# Patient Record
Sex: Female | Born: 1950 | Race: White | Hispanic: No | Smoking: Never smoker
Health system: Southern US, Community
[De-identification: ages and names within clinical notes are randomized; demographics above are authoritative.]

## PROBLEM LIST (undated history)

## (undated) DIAGNOSIS — N950 Postmenopausal bleeding: Secondary | ICD-10-CM

## (undated) DIAGNOSIS — E039 Hypothyroidism, unspecified: Secondary | ICD-10-CM

## (undated) DIAGNOSIS — C4491 Basal cell carcinoma of skin, unspecified: Secondary | ICD-10-CM

## (undated) DIAGNOSIS — F909 Attention-deficit hyperactivity disorder, unspecified type: Secondary | ICD-10-CM

## (undated) DIAGNOSIS — M1990 Unspecified osteoarthritis, unspecified site: Secondary | ICD-10-CM

## (undated) DIAGNOSIS — F32A Depression, unspecified: Secondary | ICD-10-CM

## (undated) DIAGNOSIS — R600 Localized edema: Secondary | ICD-10-CM

## (undated) DIAGNOSIS — E079 Disorder of thyroid, unspecified: Secondary | ICD-10-CM

## (undated) DIAGNOSIS — C4492 Squamous cell carcinoma of skin, unspecified: Secondary | ICD-10-CM

## (undated) DIAGNOSIS — F419 Anxiety disorder, unspecified: Secondary | ICD-10-CM

## (undated) DIAGNOSIS — K219 Gastro-esophageal reflux disease without esophagitis: Secondary | ICD-10-CM

## (undated) DIAGNOSIS — E785 Hyperlipidemia, unspecified: Secondary | ICD-10-CM

## (undated) DIAGNOSIS — E876 Hypokalemia: Secondary | ICD-10-CM

## (undated) HISTORY — DX: Attention-deficit hyperactivity disorder, unspecified type: F90.9

## (undated) HISTORY — DX: Anxiety disorder, unspecified: F41.9

## (undated) HISTORY — DX: Localized edema: R60.0

## (undated) HISTORY — DX: Hyperlipidemia, unspecified: E78.5

## (undated) HISTORY — DX: Hypokalemia: E87.6

## (undated) HISTORY — DX: Squamous cell carcinoma of skin, unspecified: C44.92

## (undated) HISTORY — DX: Basal cell carcinoma of skin, unspecified: C44.91

## (undated) HISTORY — PX: OTHER SURGICAL HISTORY: SHX169

## (undated) HISTORY — DX: Postmenopausal bleeding: N95.0

## (undated) HISTORY — DX: Unspecified osteoarthritis, unspecified site: M19.90

## (undated) HISTORY — PX: HAND SURGERY: SHX662

## (undated) HISTORY — DX: Gastro-esophageal reflux disease without esophagitis: K21.9

## (undated) HISTORY — PX: POPLITEAL SYNOVIAL CYST EXCISION: SUR555

## (undated) HISTORY — PX: TONSILLECTOMY: SHX5217

## (undated) HISTORY — DX: Disorder of thyroid, unspecified: E07.9

---

## 1998-03-27 ENCOUNTER — Ambulatory Visit (HOSPITAL_COMMUNITY): Admission: RE | Admit: 1998-03-27 | Discharge: 1998-03-27 | Payer: Self-pay | Admitting: *Deleted

## 1999-03-09 ENCOUNTER — Other Ambulatory Visit: Admission: RE | Admit: 1999-03-09 | Discharge: 1999-03-09 | Payer: Self-pay | Admitting: *Deleted

## 1999-04-19 ENCOUNTER — Encounter: Payer: Self-pay | Admitting: *Deleted

## 1999-04-19 ENCOUNTER — Ambulatory Visit (HOSPITAL_COMMUNITY): Admission: RE | Admit: 1999-04-19 | Discharge: 1999-04-19 | Payer: Self-pay | Admitting: *Deleted

## 2000-03-06 ENCOUNTER — Other Ambulatory Visit: Admission: RE | Admit: 2000-03-06 | Discharge: 2000-03-06 | Payer: Self-pay | Admitting: *Deleted

## 2000-09-09 DIAGNOSIS — E079 Disorder of thyroid, unspecified: Secondary | ICD-10-CM

## 2000-09-09 HISTORY — DX: Disorder of thyroid, unspecified: E07.9

## 2000-12-11 ENCOUNTER — Ambulatory Visit (HOSPITAL_COMMUNITY): Admission: RE | Admit: 2000-12-11 | Discharge: 2000-12-11 | Payer: Self-pay | Admitting: *Deleted

## 2000-12-11 ENCOUNTER — Encounter: Payer: Self-pay | Admitting: *Deleted

## 2001-02-11 ENCOUNTER — Other Ambulatory Visit: Admission: RE | Admit: 2001-02-11 | Discharge: 2001-02-11 | Payer: Self-pay | Admitting: *Deleted

## 2002-01-07 DIAGNOSIS — N950 Postmenopausal bleeding: Secondary | ICD-10-CM

## 2002-01-07 HISTORY — PX: HYSTEROSCOPY: SHX211

## 2002-01-07 HISTORY — DX: Postmenopausal bleeding: N95.0

## 2002-01-29 ENCOUNTER — Encounter (INDEPENDENT_AMBULATORY_CARE_PROVIDER_SITE_OTHER): Payer: Self-pay | Admitting: *Deleted

## 2002-01-29 ENCOUNTER — Ambulatory Visit (HOSPITAL_COMMUNITY): Admission: RE | Admit: 2002-01-29 | Discharge: 2002-01-29 | Payer: Self-pay | Admitting: Obstetrics and Gynecology

## 2002-03-18 ENCOUNTER — Other Ambulatory Visit: Admission: RE | Admit: 2002-03-18 | Discharge: 2002-03-18 | Payer: Self-pay | Admitting: Obstetrics and Gynecology

## 2002-09-10 ENCOUNTER — Encounter: Payer: Self-pay | Admitting: *Deleted

## 2002-09-10 ENCOUNTER — Ambulatory Visit (HOSPITAL_COMMUNITY): Admission: RE | Admit: 2002-09-10 | Discharge: 2002-09-10 | Payer: Self-pay | Admitting: *Deleted

## 2003-04-11 ENCOUNTER — Other Ambulatory Visit: Admission: RE | Admit: 2003-04-11 | Discharge: 2003-04-11 | Payer: Self-pay | Admitting: Obstetrics and Gynecology

## 2003-11-28 ENCOUNTER — Ambulatory Visit (HOSPITAL_COMMUNITY): Admission: RE | Admit: 2003-11-28 | Discharge: 2003-11-28 | Payer: Self-pay | Admitting: *Deleted

## 2004-03-13 ENCOUNTER — Encounter: Admission: RE | Admit: 2004-03-13 | Discharge: 2004-03-13 | Payer: Self-pay | Admitting: Neurology

## 2004-05-30 ENCOUNTER — Other Ambulatory Visit: Admission: RE | Admit: 2004-05-30 | Discharge: 2004-05-30 | Payer: Self-pay | Admitting: Obstetrics and Gynecology

## 2004-06-01 ENCOUNTER — Encounter: Admission: RE | Admit: 2004-06-01 | Discharge: 2004-06-01 | Payer: Self-pay | Admitting: *Deleted

## 2004-09-17 ENCOUNTER — Ambulatory Visit (HOSPITAL_COMMUNITY): Admission: RE | Admit: 2004-09-17 | Discharge: 2004-09-17 | Payer: Self-pay | Admitting: Gastroenterology

## 2005-07-18 ENCOUNTER — Other Ambulatory Visit: Admission: RE | Admit: 2005-07-18 | Discharge: 2005-07-18 | Payer: Self-pay | Admitting: Obstetrics and Gynecology

## 2006-03-26 ENCOUNTER — Ambulatory Visit (HOSPITAL_COMMUNITY): Admission: RE | Admit: 2006-03-26 | Discharge: 2006-03-26 | Payer: Self-pay | Admitting: Obstetrics & Gynecology

## 2006-07-21 ENCOUNTER — Other Ambulatory Visit: Admission: RE | Admit: 2006-07-21 | Discharge: 2006-07-21 | Payer: Self-pay | Admitting: Obstetrics & Gynecology

## 2007-03-18 ENCOUNTER — Encounter (INDEPENDENT_AMBULATORY_CARE_PROVIDER_SITE_OTHER): Payer: Self-pay | Admitting: Orthopedic Surgery

## 2007-03-18 ENCOUNTER — Ambulatory Visit (HOSPITAL_COMMUNITY): Admission: RE | Admit: 2007-03-18 | Discharge: 2007-03-19 | Payer: Self-pay | Admitting: Orthopedic Surgery

## 2007-03-31 ENCOUNTER — Ambulatory Visit (HOSPITAL_COMMUNITY): Admission: RE | Admit: 2007-03-31 | Discharge: 2007-03-31 | Payer: Self-pay | Admitting: Obstetrics and Gynecology

## 2007-07-27 ENCOUNTER — Other Ambulatory Visit: Admission: RE | Admit: 2007-07-27 | Discharge: 2007-07-27 | Payer: Self-pay | Admitting: Obstetrics and Gynecology

## 2008-05-06 ENCOUNTER — Ambulatory Visit (HOSPITAL_COMMUNITY): Admission: RE | Admit: 2008-05-06 | Discharge: 2008-05-06 | Payer: Self-pay | Admitting: Obstetrics and Gynecology

## 2008-05-09 ENCOUNTER — Ambulatory Visit (HOSPITAL_COMMUNITY): Admission: RE | Admit: 2008-05-09 | Discharge: 2008-05-09 | Payer: Self-pay | Admitting: Obstetrics and Gynecology

## 2008-08-16 ENCOUNTER — Other Ambulatory Visit: Admission: RE | Admit: 2008-08-16 | Discharge: 2008-08-16 | Payer: Self-pay | Admitting: Obstetrics & Gynecology

## 2009-05-30 ENCOUNTER — Ambulatory Visit (HOSPITAL_COMMUNITY): Admission: RE | Admit: 2009-05-30 | Discharge: 2009-05-30 | Payer: Self-pay | Admitting: Obstetrics and Gynecology

## 2009-07-07 ENCOUNTER — Encounter (INDEPENDENT_AMBULATORY_CARE_PROVIDER_SITE_OTHER): Payer: Self-pay | Admitting: Orthopedic Surgery

## 2009-07-07 ENCOUNTER — Ambulatory Visit (HOSPITAL_COMMUNITY): Admission: RE | Admit: 2009-07-07 | Discharge: 2009-07-08 | Payer: Self-pay | Admitting: Orthopedic Surgery

## 2010-03-02 ENCOUNTER — Encounter: Admission: RE | Admit: 2010-03-02 | Discharge: 2010-03-02 | Payer: Self-pay | Admitting: Obstetrics & Gynecology

## 2010-12-13 LAB — APTT: aPTT: 23 seconds — ABNORMAL LOW (ref 24–37)

## 2010-12-13 LAB — DIFFERENTIAL
Basophils Absolute: 0 10*3/uL (ref 0.0–0.1)
Eosinophils Relative: 3 % (ref 0–5)
Neutro Abs: 3.4 10*3/uL (ref 1.7–7.7)

## 2010-12-13 LAB — COMPREHENSIVE METABOLIC PANEL
ALT: 33 U/L (ref 0–35)
AST: 31 U/L (ref 0–37)
Albumin: 3.9 g/dL (ref 3.5–5.2)
Alkaline Phosphatase: 75 U/L (ref 39–117)
CO2: 32 mEq/L (ref 19–32)
Chloride: 102 mEq/L (ref 96–112)
Creatinine, Ser: 0.86 mg/dL (ref 0.4–1.2)
GFR calc non Af Amer: >60 mL/min (ref >60)
Glucose, Bld: 95 mg/dL (ref 70–99)
Potassium: 4.6 mEq/L (ref 3.5–5.1)
Sodium: 139 mEq/L (ref 135–145)
Total Protein: 6.2 g/dL (ref 6.0–8.3)

## 2010-12-13 LAB — URINALYSIS, ROUTINE W REFLEX MICROSCOPIC
Hgb urine dipstick: NEGATIVE
Nitrite: NEGATIVE
Protein, ur: NEGATIVE mg/dL
Urobilinogen, UA: 0.2 mg/dL (ref 0.0–1.0)

## 2011-01-22 NOTE — Op Note (Signed)
NAMETOYA, PALACIOS             ACCOUNT NO.:  [id number]   MEDICAL RECORD NO.:  [id number]          PATIENT TYPE:  AMB   LOCATION:  DAY                          FACILITY:  [facility]   PHYSICIAN:  Ronald A. Gioffre, M.D.DATE OF BIRTH:  10-30-1950   DATE OF PROCEDURE:  03/18/2007  DATE OF DISCHARGE:                               OPERATIVE REPORT   SURGEON:  Georges Lynch. Darrelyn Hillock, M.D.   OPERATIONS:  Arlyn Leak PA   PREOPERATIVE DIAGNOSIS:  Large dissecting popliteal cyst of the left  knee.   POSTOPERATIVE DIAGNOSIS:  Large dissecting popliteal cyst of the left  knee.   OPERATION:  Excision of a large popliteal cyst left popliteal space.   PROCEDURE:  Under general anesthesia, routine orthopedic prep and drape  of the left lower extremity carried out.  She was given 1 gram of IV  Ancef.  At this time leg was exsanguinated with Esmarch and tourniquet  was elevated 350 mmHg.  A diagonal type incision was made over the  popliteal space on the left.  Great care was taken not to injure the  underlying neurovascular structures.  I then went down and then inserted  self-retaining retractors, identified the popliteal fascia, incised the  fascia, I stayed medial, stayed well away from the small saphenous vein  and sural nerve.  At this time I went down identified both ends of the  gastroc muscle.  There was a extremely large popliteal cyst.  I stripped  the cyst first from the medial head of the gastroc tendon and then  opened the cyst, drained it and then grasped the cyst with the clamps  and dissected all the way down to the origin the cyst and removed the  cyst.  Great care was taken not to injure underlying deep structures.  I  thoroughly irrigated out the wound and then applied some thrombin-soaked  Gelfoam, closed the wound layers in the usual fashion and Steri-Strips  were applied to the skin.  A sterile dressing was applied.  She was  placed in the knee immobilizer.  Note the specimen  was sent to lab for  evaluation.           ______________________________  Georges Lynch Darrelyn Hillock, M.D.     RAG/MEDQ  D:  03/18/2007  T:  03/18/2007  Job:  [id number]

## 2011-01-25 NOTE — Op Note (Signed)
[facility]  Patient:    Catherine Peterson, Catherine Peterson Visit Number: [id number] MRN: [id number]          Service Type: DSU Location: [facility] Attending Physician:  Jenean Lindau Dictated by:   Laqueta Linden, M.D. Proc. Date: 01/29/02 Admit Date:  01/29/2002 Discharge Date: 01/29/2002                             Operative Report  PREOPERATIVE DIAGNOSIS:       Postmenopausal bleeding due to endometrial                               polyp.  POSTOPERATIVE DIAGNOSIS:      Postmenopausal bleeding due to endometrial                               polyp.  OPERATION:                    Hysteroscopic resection with curettage.  SURGEON:                      Laqueta Linden, M.D.  ANESTHESIA:                   General LMA.  ESTIMATED BLOOD LOSS:         Less than 20 cc.  SORBITOL NET INTAKE:          90 cc.  COMPLICATIONS:                None.  INDICATIONS:                  The patient is a 60 year old para 2 menopausal female on hormone replacement therapy who began having abnormal uterine bleeding.  She underwent pelvic ultrasound with sonohistogram which revealed a normal size uterus with an 8.5 mm endometrial stripe and normal-appearing ovaries.  Sonohistogram revealed an endometrial polyp, measuring 14 x 11 x 7 cm that appeared to be fundal and anterior in attachment.  The patient was seen in consultation and the alternative of hysteroscopic resection was discussed with her.  She saw the informed consent film and was advised of the risks, benefits, alternatives, and complications, as well as recovery expectations and recurrent risks and agreed to proceed.  Full consent was given.  DESCRIPTION OF PROCEDURE:     The patient was taken to the operating room and after proper identification and consents were ascertained, she was placed on the operating table in the supine position.  After general LMA anesthesia was induced, she was placed in the [place] stirrups,  and the perineum and vagina were prepped and draped in a routine sterile fashion.  A transurethral Foley was placed which was removed at the conclusion of the procedure.  Bimanual examination confirmed an anterior, normal size, and mobile uterus.  A speculum was placed in the vagina and the cervix was grasped with a single tooth tenaculum.  The uterine cavity sounded to 9 cm.  The internal os was gently dilated to a #33 Pratt dilator.  The resectoscope with continuous sorbitol infusion and video was then inserted under direct vision.  The endocervical canal was free of lesions.  There was a large smooth polyp filling the endometrial cavity which appeared to be emanating from the fundus.  The double loupe resectoscope was placed on a routine setting and the polyp was systematically resected in its entirety.  At one point, the polyp forceps were inserted and a large piece of the polyp was removed.  Several small bleeding points were cauterized.  There were some raw edges around the base of the polyp, but no residual polyp noted.  There were no other endometrial lesions noted, and in fact, the remainder of the cavity appeared quite atrophic. Sharp curettage was performed with removal of a scant amount of additional tissue which was sent separately.  All instruments were then removed.  Net sorbitol intake 90 cc.  Tenaculum site was hemostatic.  There was no active bleeding from the cervix.  The patient was stable on transfer to the recovery room.  She will be observed at discharge per anesthesia protocol.  She received Toradol 30 mg IV and 30 mg IM in the recovery room.  She will be given routine written and verbal discharge instructions, and is to follow up in the office in four to six weeks time.  She is to take Advil or Aleve as needed for any cramping and continue on her routine medications at home.  She is to call prior to her scheduled follow up for excessive pain, fever, bleeding, or any  other concerns. Dictated by:   Laqueta Linden, M.D. Attending Physician:  Jenean Lindau DD:  01/29/02 TD:  02/01/02 Job: [id number]

## 2011-01-25 NOTE — Op Note (Signed)
Catherine Peterson, Catherine Peterson             ACCOUNT NO.:  [id number]   MEDICAL RECORD NO.:  [id number]          PATIENT TYPE:  AMB   LOCATION:  ENDO                         FACILITY:  [facility]   PHYSICIAN:  Anselmo Rod, M.D.  DATE OF BIRTH:  [birth year]   DATE OF PROCEDURE:  09/17/2004  DATE OF DISCHARGE:                                 OPERATIVE REPORT   PROCEDURE PERFORMED:  Screening colonoscopy/endoscopy.   ENDOSCOPIST:  Anselmo Rod, M.D.   INSTRUMENT USED:  Olympus video colonoscope.   INDICATION FOR PROCEDURE:  A 60 year old, white female undergoing screening  colonoscopy to rule out colonic polyps, masses, etc.   PREPROCEDURE PREPARATION:  Informed consent was procured from the patient.  The patient was fasted for 8 hours prior to the procedure and prepped with a  bottle of magnesium citrate and a gallon of GoLYTELY the night prior to the  procedure.  All the risks and benefits of the procedure including a 10% miss  rate of cancer and polyps were discussed with her as well.   PREPROCEDURE PHYSICAL EXAMINATION:  VITAL SIGNS:  The patient with stable  vital signs.  NECK:  Supple.  CHEST:  Clear to auscultation.  CARDIAC:  S1, S2, regular.  ABDOMEN:  Soft with normal bowel sounds.   DESCRIPTION OF PROCEDURE:  The patient was placed in the left lateral  decubitus position, sedated with 70 mg of Demerol and 7 mg of Versed in slow  incremental doses.  Once the patient was adequately sedated and maintained  on low-flow oxygen and continuous cardiac monitoring, the Olympus video  colonoscope was advanced from the rectum to the cecum.  The appendiceal  orifice and ileocecal valve were clearly visualized and photographed.  No  masses, polyps, erosions, ulcerations, or diverticula were seen.  Small  internal hemorrhoids were seen on retroflexion.  The patient tolerated the  procedure well without complications.   IMPRESSION:  1.  Normal colonoscopy to the cecum except for small  internal hemorrhoids.  2.  No masses, polyps, or diverticula seen.   RECOMMENDATIONS:  1.  Continue a high fiber diet with liberal fluid intake.  2.  Repeat colonoscopy in the next 5 years unless the patient develops any      abnormal symptoms in the interim.  3.  Outpatient follow up as the need arises in the future.      Jyot   JNM/MEDQ  D:  09/17/2004  T:  09/17/2004  Job:  [id number]   cc:   Andres Ege, M.D.  [address]  [place]  [place] [address]  Fax: [phone]   Rema Fendt, NP

## 2011-03-25 ENCOUNTER — Other Ambulatory Visit: Payer: Self-pay | Admitting: Obstetrics and Gynecology

## 2011-03-25 DIAGNOSIS — Z1231 Encounter for screening mammogram for malignant neoplasm of breast: Secondary | ICD-10-CM

## 2011-03-27 ENCOUNTER — Ambulatory Visit
Admission: RE | Admit: 2011-03-27 | Discharge: 2011-03-27 | Disposition: A | Discharge status: Home / Self Care | Payer: BC Managed Care – PPO | Source: Ambulatory Visit | Attending: Family Medicine | Admitting: Family Medicine

## 2011-03-27 ENCOUNTER — Other Ambulatory Visit: Payer: Self-pay | Admitting: Family Medicine

## 2011-04-02 ENCOUNTER — Ambulatory Visit (HOSPITAL_COMMUNITY)
Admission: RE | Admit: 2011-04-02 | Discharge: 2011-04-02 | Disposition: A | Discharge status: Home / Self Care | Payer: BC Managed Care – PPO | Source: Ambulatory Visit | Attending: Obstetrics and Gynecology | Admitting: Obstetrics and Gynecology

## 2011-04-02 DIAGNOSIS — Z1231 Encounter for screening mammogram for malignant neoplasm of breast: Secondary | ICD-10-CM

## 2011-04-04 ENCOUNTER — Other Ambulatory Visit: Payer: Self-pay | Admitting: Obstetrics and Gynecology

## 2011-04-04 DIAGNOSIS — R928 Other abnormal and inconclusive findings on diagnostic imaging of breast: Secondary | ICD-10-CM

## 2011-04-10 ENCOUNTER — Ambulatory Visit
Admission: RE | Admit: 2011-04-10 | Discharge: 2011-04-10 | Disposition: A | Discharge status: Home / Self Care | Payer: BC Managed Care – PPO | Source: Ambulatory Visit | Attending: Obstetrics and Gynecology | Admitting: Obstetrics and Gynecology

## 2011-04-10 DIAGNOSIS — R928 Other abnormal and inconclusive findings on diagnostic imaging of breast: Secondary | ICD-10-CM

## 2011-05-09 ENCOUNTER — Encounter (INDEPENDENT_AMBULATORY_CARE_PROVIDER_SITE_OTHER): Payer: Self-pay | Admitting: Surgery

## 2011-05-14 ENCOUNTER — Encounter (INDEPENDENT_AMBULATORY_CARE_PROVIDER_SITE_OTHER): Payer: Self-pay | Admitting: Surgery

## 2011-05-21 ENCOUNTER — Encounter (INDEPENDENT_AMBULATORY_CARE_PROVIDER_SITE_OTHER): Payer: Self-pay | Admitting: Surgery

## 2011-05-21 ENCOUNTER — Ambulatory Visit (INDEPENDENT_AMBULATORY_CARE_PROVIDER_SITE_OTHER): Payer: BC Managed Care – PPO | Admitting: Surgery

## 2011-05-21 VITALS — BP 104/78 | HR 80 | Temp 97.8°F | Ht 62.5 in | Wt 111.8 lb

## 2011-05-21 DIAGNOSIS — K8010 Calculus of gallbladder with chronic cholecystitis without obstruction: Secondary | ICD-10-CM

## 2011-05-21 NOTE — Progress Notes (Signed)
Chief Complaint  Patient presents with  . Cholelithiasis    HPI Catherine Peterson is a 60 y.o. female.   HPI This is a 60 year old female who presents with several years of intermittent right upper quadrant abdominal pain associated with some nausea and diarrhea. Recently the symptoms have become more frequent and more severe. The episodes have been lasting up to an hour. She was evaluated by her primary care physician who ordered an ultrasound. The ultrasound showed a 1.4 cm gallstone but no wall thickening or pericholecystic fluid. Common bile duct was normal. She continues to have symptoms almost daily. She presents now for surgical evaluation.  Past Medical History  Diagnosis Date  . GERD (gastroesophageal reflux disease)   . Hyperlipidemia   . Hypokalemia   . ADHD (attention deficit hyperactivity disorder)   . Thyroid disease     hypothyroidism    Past Surgical History  Procedure Date  . Tonsillectomy   . Popliteal synovial cyst excision 2008 and 2010    Family History  Problem Relation Age of Onset  . Cancer Mother     myeloma  . Cancer Father     oral    Social History History  Substance Use Topics  . Smoking status: Never Smoker   . Smokeless tobacco: Never Used  . Alcohol Use: No    Allergies  Allergen Reactions  . Codeine     Current Outpatient Prescriptions  Medication Sig Dispense Refill  . ACETAMINOPHEN-BUTALBITAL (BUPAP) 50-650 MG TABS Take 50-650 mg by mouth every 4 (four) hours.        Marland Kitchen amphetamine-dextroamphetamine (ADDERALL XR, 30MG ,) 30 MG 24 hr capsule Take 30 mg by mouth every morning.        Marland Kitchen aspirin 81 MG tablet Take 81 mg by mouth daily.        Marland Kitchen buPROPion (WELLBUTRIN XL) 300 MG 24 hr tablet Take 300 mg by mouth daily.        . cholecalciferol (VITAMIN D) 1000 UNITS tablet Take 1,000 Units by mouth daily.        Marland Kitchen estradiol (ESTRACE) 0.5 MG tablet Take 0.5 mg by mouth daily.        Marland Kitchen estradiol (VAGIFEM) 25 MCG vaginal tablet Place 10  mcg vaginally daily.        . folic acid (FOLVITE) 400 MCG tablet Take 400 mcg by mouth daily.        . Ginkgo Biloba 120 MG CAPS Take 120 mg by mouth.        . hydrochlorothiazide 25 MG tablet Take 25 mg by mouth daily.        . medroxyPROGESTERone (PROVERA) 2.5 MG tablet Take 2.5 mg by mouth daily.        . Melatonin 3 MG CAPS Take 3 mg by mouth.        . Multiple Vitamin (MULTIVITAMIN) capsule Take 1 capsule by mouth daily.        Marland Kitchen thyroid (ARMOUR) 60 MG tablet Take 60 mg by mouth daily.          Review of Systems Review of Systems  Blood pressure 104/78, pulse 80, temperature 97.8 F (36.6 C), temperature source Temporal, height 5' 2.5" (1.588 m), weight 111 lb 12.8 oz (50.712 kg). ROS positive for leg swelling, abdominal pain, nausea, diarrhea Physical Exam Physical Exam WDWN in NAD HEENT:  EOMI, sclera anicteric Neck:  No masses, no thyromegaly Lungs:  CTA bilaterally; normal respiratory effort CV:  Regular rate and rhythm; no  murmurs Abd:  +bowel sounds, soft, mildly tender RUQ; no palpable masses Ext:  Well-perfused; no edema Skin:  Warm, dry; no sign of jaundice. Data Reviewed U/S - 1.4 x 0.7 x 0.3 cm gallstone; nl CBD  Assessment    Chronic calculus cholecystitis    Plan    Laparoscopic cholecystectomy with intraoperative cholangiogram.  I discussed the procedure in detail.  The patient was given Agricultural engineer.  We discussed the risks and benefits of a laparoscopic cholecystectomy including, but not limited to bleeding, infection, injury to surrounding structures such as the intestine or liver, bile leak, retained gallstones, need to convert to an open procedure, prolonged diarrhea, blood clots such as  DVT, common bile duct injury, anesthesia risks, and possible need for additional procedures.  We discussed the typical post-operative recovery course.        Lekha Dancer K. 05/21/2011, 3:27 PM

## 2011-05-30 ENCOUNTER — Encounter (HOSPITAL_COMMUNITY)
Admission: RE | Admit: 2011-05-30 | Discharge: 2011-05-30 | Disposition: A | Discharge status: Home / Self Care | Payer: BC Managed Care – PPO | Source: Ambulatory Visit | Attending: Surgery | Admitting: Surgery

## 2011-05-30 LAB — COMPREHENSIVE METABOLIC PANEL
AST: 23 U/L (ref 0–37)
CO2: 30 mEq/L (ref 19–32)
Calcium: 9.7 mg/dL (ref 8.4–10.5)
Creatinine, Ser: 0.77 mg/dL (ref 0.50–1.10)
GFR calc Af Amer: >60 mL/min (ref >60)
GFR calc non Af Amer: >60 mL/min (ref >60)
Glucose, Bld: 99 mg/dL (ref 70–99)

## 2011-05-30 LAB — CBC
MCH: 31.7 pg (ref 26.0–34.0)
MCHC: 35.3 g/dL (ref 30.0–36.0)
MCV: 89.9 fL (ref 78.0–100.0)
Platelets: 241 10*3/uL (ref 150–400)
RBC: 4.04 MIL/uL (ref 3.87–5.11)

## 2011-05-30 LAB — DIFFERENTIAL
Basophils Absolute: 0 10*3/uL (ref 0.0–0.1)
Basophils Relative: 1 % (ref 0–1)
Eosinophils Absolute: 0.1 10*3/uL (ref 0.0–0.7)
Monocytes Relative: 8 % (ref 3–12)
Neutro Abs: 2.9 10*3/uL (ref 1.7–7.7)
Neutrophils Relative %: 50 % (ref 43–77)

## 2011-05-30 LAB — SURGICAL PCR SCREEN: Staphylococcus aureus: NEGATIVE

## 2011-06-07 ENCOUNTER — Ambulatory Visit (HOSPITAL_COMMUNITY)
Admission: RE | Admit: 2011-06-07 | Discharge: 2011-06-07 | Disposition: A | Discharge status: Home / Self Care | Payer: BC Managed Care – PPO | Source: Ambulatory Visit | Attending: Surgery | Admitting: Surgery

## 2011-06-07 ENCOUNTER — Other Ambulatory Visit (INDEPENDENT_AMBULATORY_CARE_PROVIDER_SITE_OTHER): Payer: Self-pay | Admitting: Surgery

## 2011-06-07 ENCOUNTER — Ambulatory Visit (HOSPITAL_COMMUNITY): Payer: BC Managed Care – PPO

## 2011-06-07 DIAGNOSIS — K8010 Calculus of gallbladder with chronic cholecystitis without obstruction: Secondary | ICD-10-CM

## 2011-06-07 DIAGNOSIS — Z01812 Encounter for preprocedural laboratory examination: Secondary | ICD-10-CM | POA: Insufficient documentation

## 2011-06-07 HISTORY — PX: CHOLECYSTECTOMY, LAPAROSCOPIC: SHX56

## 2011-06-10 ENCOUNTER — Telehealth (INDEPENDENT_AMBULATORY_CARE_PROVIDER_SITE_OTHER): Payer: Self-pay

## 2011-06-10 NOTE — Op Note (Signed)
[name], Catherine Peterson             ACCOUNT NO.:  [id number]  MEDICAL RECORD NO.:  [id number]  LOCATION:  SDSC                         FACILITY:  [facility]  PHYSICIAN:  Wilmon Arms. Corliss Skains, M.D. DATE OF BIRTH:  [birth year]  DATE OF PROCEDURE:  06/07/2011 DATE OF DISCHARGE:                              OPERATIVE REPORT   PREOPERATIVE DIAGNOSIS:  Chronic calculous cholecystitis.  POSTOPERATIVE DIAGNOSIS:  Chronic calculous cholecystitis.  PROCEDURE:  Laparoscopic cholecystectomy with intraoperative cholangiogram.  SURGEON:  Wilmon Arms. Trayveon Beckford, MD.  ANESTHESIA:  General.  INDICATIONS:  This is a 60 year old female, who presents with a history of intermittent right upper quadrant pain over the last several years associated with nausea and diarrhea.  Ultrasound showed a 1.4 cm gallstone.  Liver function tests were normal.  She presents now for cholecystectomy.  DESCRIPTION OF PROCEDURE:  The patient was brought to the operating, placed in supine position on operating table.  After adequate level of general anesthesia was obtained, the patient's abdomen was prepped with ChloraPrep and draped in sterile fashion.  Time-out was taken to assure the proper patient and proper procedure.  We infiltrated the area around the umbilicus with 0.25% Marcaine with epinephrine.  We made a transverse incision below the umbilicus.  Dissection was carried down to the fascia.  The fascia was incised vertically.  We entered the peritoneal cavity bluntly.  A stay suture of 0 Vicryl was placed around the fascial opening.  The Hasson cannula was inserted and secured to stay suture.  Pneumoperitoneum was obtained by insufflating CO2, maintaining the maximal pressure of 15 mmHg.  The laparoscope was inserted and the patient was positioned in a reverse Trendelenburg, tilted to her left.  The liver had some chronic scarring, but otherwise appeared normal.  The gallbladder was fairly large, but was not thickened or  distended.  An 11-mm port was placed in the subxiphoid position.  Two 5-mm ports were placed in right upper quadrant.  The gallbladder was grasped with clamp and lifted.  There was some omental adhesions to the gallbladder.  These were taken down with cautery.  We opened the peritoneum around the hilum of the gallbladder and bluntly dissect around the cystic duct and cystic artery.  The cystic duct was ligated and clipped distally.  A small opening was created on the cystic duct.  A cholangiogram was then obtained which showed good flow proximally and distally in the biliary tree with no sign of filling defect.  Contrast flowed easily into duodenum.  The catheter was removed and cystic ducts ligated with clips divided.  The cystic artery was ligated with clips divided.  The gallbladder was then placed in an EndoCatch sac after being removed from the liver.  We inspected the gallbladder fossa thoroughly for hemostasis.  Hemostasis was good.  We removed the gallbladder and EndoCatch sac through the umbilical port site.  The fascia was closed with pursestring suture.  Pneumoperitoneum was then released as we remove the trocars.  A 4-0 Monocryl was used to close the skin incisions.  Steri-Strips and clean dressings were applied.  The patient was extubated and brought to recovery in stable condition.  All sponge, instrument, and needle counts were correct.  Wilmon Arms. Corliss Skains, M.D.     MKT/MEDQ  D:  06/07/2011  T:  06/07/2011  Job:  [id number]  Electronically Signed by Manus Rudd M.D. on 06/10/2011 09:37:00 AM

## 2011-06-10 NOTE — Telephone Encounter (Signed)
I called pt and checked on her post op.  She is doing very well.  She stopped her pain med yesterday.  She is up moving around.  She states she is using an ace wrap around her abdomen for support which I told her is fine.  She doesn't have it too tight.  She has her follow up appointment and will call if has any concerns.

## 2011-06-25 ENCOUNTER — Encounter (INDEPENDENT_AMBULATORY_CARE_PROVIDER_SITE_OTHER): Payer: Self-pay | Admitting: Surgery

## 2011-06-25 LAB — BASIC METABOLIC PANEL
BUN: 22
Chloride: 96
Glucose, Bld: 124 — ABNORMAL HIGH
Potassium: 3.2 — ABNORMAL LOW

## 2011-06-25 LAB — HEMOGLOBIN AND HEMATOCRIT, BLOOD: Hemoglobin: 16.8 — ABNORMAL HIGH

## 2011-06-27 ENCOUNTER — Encounter (INDEPENDENT_AMBULATORY_CARE_PROVIDER_SITE_OTHER): Payer: Self-pay | Admitting: Surgery

## 2011-06-27 ENCOUNTER — Ambulatory Visit (INDEPENDENT_AMBULATORY_CARE_PROVIDER_SITE_OTHER): Payer: BC Managed Care – PPO | Admitting: Surgery

## 2011-06-27 VITALS — BP 112/76 | HR 72 | Temp 97.3°F | Resp 12 | Ht 62.5 in | Wt 111.6 lb

## 2011-06-27 DIAGNOSIS — K8010 Calculus of gallbladder with chronic cholecystitis without obstruction: Secondary | ICD-10-CM

## 2011-06-27 NOTE — Patient Instructions (Signed)
Follow-up PRN.  Resume full activity.

## 2011-06-27 NOTE — Progress Notes (Signed)
Status post left upper cholecystectomy with intraoperative cholangiogram on September 28th. Her pathology showed chronic calculus cholecystitis. She is doing very well. Her incisions are well-healed. Appetite and bowel movements are normal. She reports no complaints. She may resume full activity. Followup on a p.r.n. basis.

## 2011-10-31 LAB — HM PAP SMEAR: HM PAP: NEGATIVE

## 2012-02-04 ENCOUNTER — Other Ambulatory Visit: Payer: Self-pay | Admitting: Dermatology

## 2012-11-02 ENCOUNTER — Other Ambulatory Visit: Payer: Self-pay | Admitting: Obstetrics and Gynecology

## 2012-11-19 ENCOUNTER — Ambulatory Visit
Admission: RE | Admit: 2012-11-19 | Discharge: 2012-11-19 | Disposition: A | Discharge status: Home / Self Care | Payer: BC Managed Care – PPO | Source: Ambulatory Visit | Attending: Obstetrics and Gynecology | Admitting: Obstetrics and Gynecology

## 2012-11-19 DIAGNOSIS — Z1231 Encounter for screening mammogram for malignant neoplasm of breast: Secondary | ICD-10-CM

## 2013-01-22 ENCOUNTER — Other Ambulatory Visit: Payer: Self-pay | Admitting: Nurse Practitioner

## 2013-01-22 NOTE — Telephone Encounter (Signed)
Per p,grubb okay for pt to have refill since she had her mammo done & it was normal. pts last aex was 11/02/12

## 2013-03-24 ENCOUNTER — Other Ambulatory Visit (INDEPENDENT_AMBULATORY_CARE_PROVIDER_SITE_OTHER): Payer: Self-pay | Admitting: Surgery

## 2013-05-12 ENCOUNTER — Other Ambulatory Visit: Payer: Self-pay | Admitting: Nurse Practitioner

## 2013-05-12 NOTE — Telephone Encounter (Signed)
eScribe request for refill on Estradiol 0.5mg  #15, Takes 1/2 po daily Last filled - 03-26-13 Last AEX - 11-02-12 Next AEX - not scheduled Patient was instructed at last AEX to have mammogram before further refills on Estradiol.  Rec'd mammogram but I don't see Note okay to give further refills on Estradiol.  RX pending.

## 2013-11-08 ENCOUNTER — Ambulatory Visit: Payer: Self-pay | Admitting: Nurse Practitioner

## 2013-11-15 ENCOUNTER — Ambulatory Visit: Payer: Self-pay | Admitting: Nurse Practitioner

## 2013-11-18 ENCOUNTER — Ambulatory Visit: Payer: Self-pay | Admitting: Nurse Practitioner

## 2013-12-14 ENCOUNTER — Encounter: Payer: Self-pay | Admitting: Nurse Practitioner

## 2013-12-16 ENCOUNTER — Encounter: Payer: Self-pay | Admitting: Nurse Practitioner

## 2013-12-16 ENCOUNTER — Ambulatory Visit (INDEPENDENT_AMBULATORY_CARE_PROVIDER_SITE_OTHER): Payer: BC Managed Care – PPO | Admitting: Nurse Practitioner

## 2013-12-16 VITALS — BP 120/80 | HR 68 | Ht 62.25 in | Wt 120.0 lb

## 2013-12-16 DIAGNOSIS — M8580 Other specified disorders of bone density and structure, unspecified site: Secondary | ICD-10-CM | POA: Insufficient documentation

## 2013-12-16 DIAGNOSIS — N951 Menopausal and female climacteric states: Secondary | ICD-10-CM | POA: Insufficient documentation

## 2013-12-16 DIAGNOSIS — M949 Disorder of cartilage, unspecified: Secondary | ICD-10-CM

## 2013-12-16 DIAGNOSIS — Z01419 Encounter for gynecological examination (general) (routine) without abnormal findings: Secondary | ICD-10-CM

## 2013-12-16 DIAGNOSIS — Z0000 Encounter for general adult medical examination without abnormal findings: Secondary | ICD-10-CM

## 2013-12-16 DIAGNOSIS — M899 Disorder of bone, unspecified: Secondary | ICD-10-CM

## 2013-12-16 DIAGNOSIS — Z79890 Hormone replacement therapy (postmenopausal): Secondary | ICD-10-CM | POA: Insufficient documentation

## 2013-12-16 LAB — POCT URINALYSIS DIPSTICK
Bilirubin, UA: NEGATIVE
Glucose, UA: NEGATIVE
KETONES UA: NEGATIVE
Leukocytes, UA: NEGATIVE
Nitrite, UA: NEGATIVE
PROTEIN UA: NEGATIVE
RBC UA: NEGATIVE
UROBILINOGEN UA: NEGATIVE
pH, UA: 5

## 2013-12-16 LAB — HEMOGLOBIN, FINGERSTICK: Hemoglobin, fingerstick: 14.4 g/dL (ref 12.0–16.0)

## 2013-12-16 MED ORDER — MEDROXYPROGESTERONE ACETATE 2.5 MG PO TABS: ORAL_TABLET | ORAL | Status: DC

## 2013-12-16 MED ORDER — ESTRADIOL 10 MCG VA TABS: ORAL_TABLET | VAGINAL | Status: DC

## 2013-12-16 MED ORDER — ESTRADIOL 0.5 MG PO TABS: ORAL_TABLET | ORAL | Status: DC

## 2013-12-16 NOTE — Patient Instructions (Signed)

## 2013-12-16 NOTE — Progress Notes (Signed)
Patient ID: Catherine Peterson, female   DOB: 29-Aug-1951, 63 y.o.   MRN: [id number] 63 y.o. [id number] Widowed Caucasian Fe here for annual exam.  She is on the 1/2 dose of Estrogen.  Really does not like the lower dose but willing to stay on this.  She has had an increase in vaso symptoms and she feels like her TSH has been more irregular since being on a lower dose.  Her PCP is working with her to get her dose of thyroid more normal.  Off Lexapro from PCP secondary to shaking of hands after 2-3 months.  Not SA. She is still wanting a refill on Vagifem since it does help with extreme dryness.  Out of med's for 3-4 months. No samples available. Spending time this summer in [place] with sons.  Patient's last menstrual period was 09/10/2003.          Sexually active: no  The current method of family planning is abstinence and post menopausal status.    Exercising: yes  Home exercise routine includes walking at [place] [address], core exercises on exercise ball and yard work. Smoker:  no  Health Maintenance: Pap:  10/31/11 WNL, neg HR HPV MMG:  11/19/12 Bi-Rads 2:  benign findings Colonoscopy:  2005, repeat in 10 years BMD:  05/06/08 -1.2/-1.0/-1.2 TDaP:  3 years ago Shingles: 2013 Labs:  HB:  14.4       Urine:  Negative    reports that she has never smoked. She has never used smokeless tobacco. She reports that she does not drink alcohol or use illicit drugs.  Past Medical History  Diagnosis Date  . GERD (gastroesophageal reflux disease)   . Hyperlipidemia   . Hypokalemia   . ADHD (attention deficit hyperactivity disorder)   . PMB (postmenopausal bleeding) 01/2002    endo polyp  . Thyroid disease 2002    hypothyroidism    Past Surgical History  Procedure Laterality Date  . Tonsillectomy    . Popliteal synovial cyst excision Left 2008 and 2010  . Cholecystectomy, laparoscopic  06/07/11  . Hysteroscopy  01/2002    endo polyp and focal simple hyperplasia    Current Outpatient Prescriptions   Medication Sig Dispense Refill  . ACETAMINOPHEN-BUTALBITAL (BUPAP) 50-650 MG TABS Take 50-650 mg by mouth every 4 (four) hours.        Marland Kitchen amphetamine-dextroamphetamine (ADDERALL XR, 30MG,) 30 MG 24 hr capsule Take 30 mg by mouth every morning.        Marland Kitchen aspirin 81 MG tablet Take 81 mg by mouth daily.        Marland Kitchen buPROPion (WELLBUTRIN XL) 300 MG 24 hr tablet Take 300 mg by mouth daily.        . cholecalciferol (VITAMIN D) 1000 UNITS tablet Take 1,000 Units by mouth daily.        Marland Kitchen estradiol (ESTRACE) 0.5 MG tablet TAKE 1/2 TABLET BY MOUTH DAILY  90 tablet  3  . folic acid (FOLVITE) 458 MCG tablet Take 400 mcg by mouth daily.        . Ginkgo Biloba 120 MG CAPS Take 120 mg by mouth.        . hydrochlorothiazide 25 MG tablet Take 25 mg by mouth daily.        . medroxyPROGESTERone (PROVERA) 2.5 MG tablet TAKE 1 TABLET BY MOUTH EVERY DAY  90 tablet  3  . Melatonin 1 MG CAPS Take 1 mg by mouth daily. Take with 30m cap      .  Melatonin 3 MG CAPS Take 3 mg by mouth at bedtime. Take with 25m cap      . Multiple Vitamin (MULTIVITAMIN) capsule Take 1 capsule by mouth daily.        .Marland Kitchenthyroid (ARMOUR) 30 MG tablet Take 30 mg by mouth daily before breakfast.      . Estradiol 10 MCG TABS vaginal tablet Use at HS X 2 weeks then twice weekly  18 tablet  12   No current facility-administered medications for this visit.    Family History  Problem Relation Age of Onset  . Cancer Mother     multiple myeloma  . Cancer Father     oral  . Diabetes Father   . Diabetes Brother   . Diabetes Paternal Grandmother   . Osteoporosis    . Heart disease Brother 488   secondary to drug use    ROS:  Pertinent items are noted in HPI.  Otherwise, a comprehensive ROS was negative.  Exam:   BP 120/80  Pulse 68  Ht 5' 2.25" (1.581 m)  Wt 120 lb (54.432 kg)  BMI 21.78 kg/m2  LMP 09/10/2003 Height: 5' 2.25" (158.1 cm)  Ht Readings from Last 3 Encounters:  12/16/13 5' 2.25" (1.581 m)  06/27/11 5' 2.5" (1.588 m)   05/21/11 5' 2.5" (1.588 m)    General appearance: alert, cooperative and appears stated age Head: Normocephalic, without obvious abnormality, atraumatic Neck: no adenopathy, supple, symmetrical, trachea midline and thyroid normal to inspection and palpation Lungs: clear to auscultation bilaterally Breasts: normal appearance, no masses or tenderness Heart: regular rate and rhythm Abdomen: soft, non-tender; no masses,  no organomegaly Extremities: extremities normal, atraumatic, no cyanosis or edema Skin: Skin color, texture, turgor normal. No rashes or lesions Lymph nodes: Cervical, supraclavicular, and axillary nodes normal. No abnormal inguinal nodes palpated Neurologic: Grossly normal   Pelvic: External genitalia:  no lesions              Urethra:  normal appearing urethra with no masses, tenderness or lesions              Bartholin's and Skene's: normal                 Vagina: very atrophic appearing vagina with pale color and discharge, no lesions              Cervix: anteverted              Pap taken: yes Bimanual Exam:  Uterus:  normal size, contour, position, consistency, mobility, non-tender              Adnexa: no mass, fullness, tenderness               Rectovaginal: Confirms               Anus:  normal sphincter tone, no lesions  A:  Well Woman with normal exam  Postmenopausal on HRT since 12/96 to present  Atrophic vaginitis  Osteopenia   P:   Pap smear as per guidelines   Mammogram is due now and is scheduled  Refill Estrace 0.5 mg 1/2 tablet daily for a year  Refill Provera 2.5 mg daily for a year  Refill Vagifem after restarted nightly for 2 weeks then twice weekly for a year  Counseled with risk of DVT, CVA, cancer, etc  [facility]  Counseled on breast self exam, mammography screening, adequate intake of calcium and vitamin D, diet  and exercise, Kegel's exercises return annually or prn  An After Visit Summary was printed and given  to the patient.

## 2013-12-20 LAB — IPS PAP TEST WITH HPV

## 2013-12-20 NOTE — Progress Notes (Signed)
Encounter reviewed by Dr. Brook Silva.  

## 2014-07-11 ENCOUNTER — Encounter: Payer: Self-pay | Admitting: Nurse Practitioner

## 2014-09-26 ENCOUNTER — Other Ambulatory Visit: Payer: Self-pay

## 2014-09-26 DIAGNOSIS — Z1231 Encounter for screening mammogram for malignant neoplasm of breast: Secondary | ICD-10-CM

## 2014-09-28 ENCOUNTER — Ambulatory Visit (HOSPITAL_COMMUNITY)
Admission: RE | Admit: 2014-09-28 | Discharge: 2014-09-28 | Disposition: A | Discharge status: Home / Self Care | Payer: Medicare Other | Source: Ambulatory Visit | Attending: Nurse Practitioner | Admitting: Nurse Practitioner

## 2014-09-28 DIAGNOSIS — M8580 Other specified disorders of bone density and structure, unspecified site: Secondary | ICD-10-CM

## 2014-09-28 DIAGNOSIS — Z13820 Encounter for screening for osteoporosis: Secondary | ICD-10-CM | POA: Insufficient documentation

## 2014-09-28 DIAGNOSIS — Z780 Asymptomatic menopausal state: Secondary | ICD-10-CM | POA: Insufficient documentation

## 2014-10-04 ENCOUNTER — Other Ambulatory Visit: Payer: Self-pay

## 2014-10-04 ENCOUNTER — Ambulatory Visit
Admission: RE | Admit: 2014-10-04 | Discharge: 2014-10-04 | Disposition: A | Discharge status: Home / Self Care | Payer: Medicare Other | Source: Ambulatory Visit

## 2014-10-04 DIAGNOSIS — Z1231 Encounter for screening mammogram for malignant neoplasm of breast: Secondary | ICD-10-CM

## 2014-10-10 ENCOUNTER — Telehealth: Payer: Self-pay | Admitting: *Deleted

## 2014-10-10 NOTE — Telephone Encounter (Signed)
-----   Message from Milford Cage, [place] sent at 10/10/2014  9:05 AM EST ----- Please let patient know that BMD done 09/28/14 shows a T Score at spine at -1.4; and left hip neck -1.1.  She falls into the upper osteopenic range and remains stable from last test 8//28/2009. FRAX score for major fracture is 10 years is 7.4% (goal is <20%).  FRAX score in 10 years is 0.6% (goal is <3%).  Needs repeat in 2-3 years.

## 2014-10-10 NOTE — Telephone Encounter (Signed)
I have attempted to contact this patient by phone with the following results: left message to return call to [employer] at [phone] answering machine (home per [facility]).  No personal information given.  [phone] (Home) *Preferred*

## 2014-10-11 NOTE — Telephone Encounter (Signed)
Pt notified of results and is agreeable. Pt states she has weaned HRT.  These are removed from patient's med list.

## 2014-12-20 ENCOUNTER — Ambulatory Visit (INDEPENDENT_AMBULATORY_CARE_PROVIDER_SITE_OTHER): Payer: Medicare Other | Admitting: Nurse Practitioner

## 2014-12-20 ENCOUNTER — Encounter: Payer: Self-pay | Admitting: Nurse Practitioner

## 2014-12-20 VITALS — BP 130/66 | HR 72 | Ht 62.25 in | Wt 119.0 lb

## 2014-12-20 DIAGNOSIS — R319 Hematuria, unspecified: Secondary | ICD-10-CM

## 2014-12-20 DIAGNOSIS — Z01419 Encounter for gynecological examination (general) (routine) without abnormal findings: Secondary | ICD-10-CM

## 2014-12-20 DIAGNOSIS — Z0000 Encounter for general adult medical examination without abnormal findings: Secondary | ICD-10-CM | POA: Diagnosis not present

## 2014-12-20 DIAGNOSIS — E559 Vitamin D deficiency, unspecified: Secondary | ICD-10-CM | POA: Diagnosis not present

## 2014-12-20 MED ORDER — ESTROGENS, CONJUGATED 0.625 MG/GM VA CREA: TOPICAL_CREAM | VAGINAL | Status: DC

## 2014-12-20 NOTE — Progress Notes (Signed)
Patient ID: Catherine Peterson, female   DOB: [birth year], 64 y.o.   MRN: [id number] 64 y.o. G6P2002 Widowed  Caucasian Fe here for annual exam.  Now seeing endocrinologist she sees Dr. Chalmers Cater  Does 45 mg weekly and 30 mg on the  weekends. Will be going back there in June.  Not dating or SA.  Tapered off HRT last summer. Some increase in vaginal dryness.  Occasional vaso symptoms but tolerable.  Off Vagifem but having dryness at the urethra despite trying to use KY jelly - wants to try a cream. Taking Vit D OTC.  Patient's last menstrual period was 09/10/2003.          Sexually active: No.  The current method of family planning is none.    Exercising: Yes.    walking and doing yardwork Smoker:  no  Health Maintenance: Pap:  12/16/13, negative with neg HR HPV MMG:  10/04/14, 3D, Bi-Rads 1:  Negative Colonoscopy:  10/2014, tubular adenoma, repeat in 5 years, Dr. Collene Mares BMD:   09/28/14, T Score -1.4 S/-1.1 L TDaP:  4 years ago Shingles: 2011 Labs:  HB:  14.7  Urine:  Trace RBC   reports that she has never smoked. She has never used smokeless tobacco. She reports that she does not drink alcohol or use illicit drugs.  Past Medical History  Diagnosis Date  . GERD (gastroesophageal reflux disease)   . Hyperlipidemia   . Hypokalemia   . ADHD (attention deficit hyperactivity disorder)   . PMB (postmenopausal bleeding) 01/2002    endo polyp  . Thyroid disease 2002    hypothyroidism    Past Surgical History  Procedure Laterality Date  . Tonsillectomy    . Popliteal synovial cyst excision Left 2008 and 2010  . Cholecystectomy, laparoscopic  06/07/11  . Hysteroscopy  01/2002    endo polyp and focal simple hyperplasia    Current Outpatient Prescriptions  Medication Sig Dispense Refill  . ACETAMINOPHEN-BUTALBITAL (BUPAP) 50-650 MG TABS Take 50-650 mg by mouth every 4 (four) hours.      Marland Kitchen amphetamine-dextroamphetamine (ADDERALL XR, 30MG,) 30 MG 24 hr capsule Take 30 mg by mouth every morning.      Marland Kitchen  aspirin 81 MG tablet Take 81 mg by mouth daily.      . cholecalciferol (VITAMIN D) 1000 UNITS tablet Take 1,000 Units by mouth daily.      . folic acid (FOLVITE) 956 MCG tablet Take 400 mcg by mouth daily.      . Ginkgo Biloba 120 MG CAPS Take 120 mg by mouth.      . hydrochlorothiazide 25 MG tablet Take 25 mg by mouth daily.      . Melatonin 5 MG CAPS Take 5 mg by mouth at bedtime.    . Multiple Vitamin (MULTIVITAMIN) capsule Take 1 capsule by mouth daily.      Marland Kitchen thyroid (ARMOUR) 30 MG tablet Take 30 mg by mouth daily before breakfast.     No current facility-administered medications for this visit.    Family History  Problem Relation Age of Onset  . Cancer Mother     multiple myeloma  . Cancer Father     oral  . Diabetes Father   . Diabetes Brother   . Diabetes Paternal Grandmother   . Osteoporosis    . Heart disease Brother 37    secondary to drug use    ROS:  Pertinent items are noted in HPI.  Otherwise, a comprehensive ROS was negative.  Exam:  LMP 09/10/2003   Ht Readings from Last 3 Encounters:  12/16/13 5' 2.25" (1.581 m)  06/27/11 5' 2.5" (1.588 m)  05/21/11 5' 2.5" (1.588 m)    General appearance: alert, cooperative and appears stated age Head: Normocephalic, without obvious abnormality, atraumatic Neck: no adenopathy, supple, symmetrical, trachea midline and thyroid normal to inspection and palpation Lungs: clear to auscultation bilaterally Breasts: normal appearance, no masses or tenderness Heart: regular rate and rhythm Abdomen: soft, non-tender; no masses,  no organomegaly Extremities: extremities normal, atraumatic, no cyanosis or edema Skin: Skin color, texture, turgor normal. No rashes or lesions Lymph nodes: Cervical, supraclavicular, and axillary nodes normal. No abnormal inguinal nodes palpated Neurologic: Grossly normal   Pelvic: External genitalia:  no lesions              Urethra:  normal appearing urethra with prolapse,no tenderness or  lesions              Bartholin's and Skene's: normal                 Vagina: pale and atrophic appearing vagina with pale color and discharge, no lesions              Cervix: anteverted              Pap taken: No. Bimanual Exam:  Uterus:  normal size, contour, position, consistency, mobility, non-tender              Adnexa: no mass, fullness, tenderness               Rectovaginal: Confirms               Anus:  normal sphincter tone, no lesions  Chaperone present: no  A:  Well Woman with normal exam  Postmenopausal on HRT since 12/96 -03/2014 Atrophic vaginitis off Vagifem Osteopenia - mild at the spine  Hypothyroid - now sees Endocrinologist  History of colonic tubular adenoma  History of hyperiodemia  R/O UTI   P:   Reviewed health and wellness pertinent to exam  Pap smear not taken today  Mammogram is due 09/2015  Rx given for Premarin vaginal cream use pea size amount at the urethra twice a week.  Counseled with risk of DVT, CVA, cancer, etc.  Follow with Vit D level and urine  Counseled on breast self exam, mammography screening, use and side effects of HRT, adequate intake of calcium and vitamin D, diet and exercise, Kegel's exercises return annually or prn  An After Visit Summary was printed and given to the patient.

## 2014-12-20 NOTE — Patient Instructions (Signed)

## 2014-12-21 LAB — VITAMIN D 25 HYDROXY (VIT D DEFICIENCY, FRACTURES): Vit D, 25-Hydroxy: 44 ng/mL (ref 30–100)

## 2014-12-21 LAB — URINALYSIS, MICROSCOPIC ONLY
Bacteria, UA: NONE SEEN
CASTS: NONE SEEN
CRYSTALS: NONE SEEN
SQUAMOUS EPITHELIAL / LPF: NONE SEEN

## 2014-12-21 LAB — URINE CULTURE
Colony Count: NO GROWTH
Organism ID, Bacteria: NO GROWTH

## 2014-12-26 NOTE — Progress Notes (Signed)
Encounter reviewed by Dr. Asalee Barrette Silva.  

## 2015-03-06 ENCOUNTER — Other Ambulatory Visit: Payer: Self-pay

## 2015-06-26 ENCOUNTER — Telehealth: Payer: Self-pay | Admitting: Nurse Practitioner

## 2015-06-26 NOTE — Telephone Encounter (Signed)
Since pt age is a risk factor, I do not suggest going back on HRT.  She would be a higher risk of DVT, CVA.  But other hormones can cause an increase in vaso symptoms.  Have her check with Dr. Chalmers Cater about her thyroid levels as this may be the cause. For the general dryness - use moisturizer daily.  For the bone health - calcium and Vit D daily along with exercise and weight bearing.  This will also help her with toning and strength.

## 2015-06-26 NOTE — Telephone Encounter (Signed)
Patient called and said, "I am calling to see if it is okay for me to start back on low dose HRT." Patient's pharmacy on file is correct.

## 2015-06-26 NOTE — Telephone Encounter (Signed)
Left message to call Kaitlyn at [phone]. 

## 2015-06-26 NOTE — Telephone Encounter (Signed)
Spoke with patient. Patient states that she has been off HRT since summer of 2015. Since the Spring of this year she states she has been having increased hot flashes during the day, has gained 5 pounds, can not sleep well, and has aching in her joints. "I do not feel as well as I did when taking the medication. I am more worried about my bone health but also I am so hot I can not sleep even when I increase my melatonin." Requesting to restart on a low dose of HRT. Advised I will have to speak with Kem Boroughs, FNP regarding her recommendations and return call. Patient is agreeable.

## 2015-06-27 NOTE — Telephone Encounter (Signed)
Spoke with patient. Advised of message as seen below from Kem Boroughs, [place]. Patient is agreeable. She will contact Dr.Balan's office to have her thyroid levels checked. Will continue with calcium, vitamin D and exercise daily.   Routing to provider for final review. Patient agreeable to disposition. Will close encounter.

## 2015-09-19 ENCOUNTER — Other Ambulatory Visit: Payer: Self-pay

## 2015-09-19 DIAGNOSIS — Z1231 Encounter for screening mammogram for malignant neoplasm of breast: Secondary | ICD-10-CM

## 2015-10-10 ENCOUNTER — Ambulatory Visit
Admission: RE | Admit: 2015-10-10 | Discharge: 2015-10-10 | Disposition: A | Discharge status: Home / Self Care | Payer: Medicare Other | Source: Ambulatory Visit

## 2015-10-10 DIAGNOSIS — Z1231 Encounter for screening mammogram for malignant neoplasm of breast: Secondary | ICD-10-CM

## 2016-01-01 ENCOUNTER — Ambulatory Visit: Payer: Medicare Other | Admitting: Nurse Practitioner

## 2016-01-09 ENCOUNTER — Ambulatory Visit (INDEPENDENT_AMBULATORY_CARE_PROVIDER_SITE_OTHER): Payer: Medicare Other | Admitting: Nurse Practitioner

## 2016-01-09 ENCOUNTER — Encounter: Payer: Self-pay | Admitting: Nurse Practitioner

## 2016-01-09 VITALS — BP 124/72 | HR 64 | Ht 62.0 in | Wt 117.0 lb

## 2016-01-09 DIAGNOSIS — E559 Vitamin D deficiency, unspecified: Secondary | ICD-10-CM

## 2016-01-09 DIAGNOSIS — Z01419 Encounter for gynecological examination (general) (routine) without abnormal findings: Secondary | ICD-10-CM | POA: Diagnosis not present

## 2016-01-09 DIAGNOSIS — Z0000 Encounter for general adult medical examination without abnormal findings: Secondary | ICD-10-CM

## 2016-01-09 NOTE — Patient Instructions (Signed)

## 2016-01-09 NOTE — Progress Notes (Signed)
Patient ID: Catherine Peterson, female   DOB: Jul 14, 1951, 65 y.o.   MRN: [id number]  65 y.o. G2P2002 Widowed  Caucasian Fe here for annual exam.  This past summer had a decrease in taste and smell.  She has watery eyes and diagnosed with cataract. Test for Sjogren's was negative.   She was treated with Flonase and smell is back.    Not dating or SA.  Patient's last menstrual period was 09/10/2003.          Sexually active: No.  The current method of family planning is post menopausal status.    Exercising: Yes.    10,000+ steps per day and yardwork Smoker:  no  Health Maintenance: Pap: 12/16/13, Negative with neg HR HPV MMG: 10/10/15, 3D, Bi-Rads 1: Negative Colonoscopy:  2016, repeat in 5 years BMD:  09/28/2014 -1.4/-1.1/-1.1 TDaP: 3-4 years ago with PCP Shingles: PCP in 2011, had mild case of shingles in November 2016 Pneumonia: Prevnar 13, Fall 2016 Hep C: drawn Labs: PCP   reports that she has never smoked. She has never used smokeless tobacco. She reports that she does not drink alcohol or use illicit drugs.  Past Medical History  Diagnosis Date  . GERD (gastroesophageal reflux disease)   . Hyperlipidemia   . Hypokalemia   . ADHD (attention deficit hyperactivity disorder)   . PMB (postmenopausal bleeding) 01/2002    endo polyp  . Thyroid disease 2002    hypothyroidism    Past Surgical History  Procedure Laterality Date  . Tonsillectomy    . Popliteal synovial cyst excision Left 2008 and 2010  . Cholecystectomy, laparoscopic  06/07/11  . Hysteroscopy  01/2002    endo polyp and focal simple hyperplasia    Current Outpatient Prescriptions  Medication Sig Dispense Refill  . ACETAMINOPHEN-BUTALBITAL (BUPAP) 50-650 MG TABS Take 50-650 mg by mouth every 4 (four) hours.      Marland Kitchen amphetamine-dextroamphetamine (ADDERALL XR, 30MG,) 30 MG 24 hr capsule Take 30 mg by mouth every morning.      Marland Kitchen aspirin 81 MG tablet Take 81 mg by mouth daily.      Marland Kitchen buPROPion (WELLBUTRIN XL) 150 MG 24 hr  tablet Take 1 tablet by mouth daily.  3  . cholecalciferol (VITAMIN D) 1000 UNITS tablet Take 1,000 Units by mouth daily.      Marland Kitchen conjugated estrogens (PREMARIN) vaginal cream Use 1/2 g vaginally twice weekly 42.5 g 3  . cycloSPORINE (RESTASIS) 0.05 % ophthalmic emulsion Place 1 drop into both eyes 2 (two) times daily.    . folic acid (FOLVITE) 578 MCG tablet Take 400 mcg by mouth daily.      . Ginkgo Biloba 120 MG CAPS Take 120 mg by mouth.      . hydrochlorothiazide 25 MG tablet Take 25 mg by mouth daily.      . Melatonin 5 MG CAPS Take 8 mg by mouth at bedtime.     . Multiple Vitamin (MULTIVITAMIN) capsule Take 1 capsule by mouth daily.      Marland Kitchen thyroid (ARMOUR THYROID) 15 MG tablet Take 1 tablet by mouth daily.    Marland Kitchen thyroid (ARMOUR) 30 MG tablet Take 30 mg by mouth daily before breakfast.     No current facility-administered medications for this visit.    Family History  Problem Relation Age of Onset  . Cancer Mother     multiple myeloma  . Cancer Father     oral  . Diabetes Father   . Diabetes Brother   .  Diabetes Paternal Grandmother   . Osteoporosis    . Heart disease Brother 20    secondary to drug use    ROS:  Pertinent items are noted in HPI.  Otherwise, a comprehensive ROS was negative.  Exam:   BP 124/72 mmHg  Pulse 64  Ht _0  (1.575 m)  Wt 117 lb (53.071 kg)  BMI 21.39 kg/m2  LMP 09/10/2003 Height: _1  (157.5 cm) Ht Readings from Last 3 Encounters:  01/09/16 _2  (1.575 m)  12/20/14 5' 2.25" (1.581 m)  12/16/13 5' 2.25" (1.581 m)    General appearance: alert, cooperative and appears stated age Head: Normocephalic, without obvious abnormality, atraumatic Neck: no adenopathy, supple, symmetrical, trachea midline and thyroid normal to inspection and palpation Lungs: clear to auscultation bilaterally Breasts: normal appearance, no masses or tenderness Heart: regular rate and rhythm Abdomen: soft, non-tender; no masses,  no organomegaly Extremities:  extremities normal, atraumatic, no cyanosis or edema Skin: Skin color, texture, turgor normal. No rashes or lesions Lymph nodes: Cervical, supraclavicular, and axillary nodes normal. No abnormal inguinal nodes palpated Neurologic: Grossly normal   Pelvic: External genitalia:  no lesions              Urethra:  normal appearing urethra with no masses, tenderness or lesions              Bartholin's and Skene's: normal                 Vagina: normal appearing vagina with normal color and discharge, no lesions              Cervix: anteverted              Pap taken: No. Bimanual Exam:  Uterus:  normal size, contour, position, consistency, mobility, non-tender              Adnexa: no mass, fullness, tenderness               Rectovaginal: Confirms               Anus:  normal sphincter tone, no lesions  Chaperone present: yes  A:  Well Woman with normal exam  Postmenopausal on HRT since 12/96 -03/2014 Atrophic vaginitis off Premarin @ this time Osteopenia - mild at the spine Hypothyroid -  sees Endocrinologist History of colonic tubular adenoma History of hyperlipidemia    P:   Reviewed health and wellness pertinent to exam  Pap smear as above  Mammogram is due 09/2016  Follow with labs  Did not refill Premarin vaginal cream at this time as she is not using  Counseled on breast self exam, mammography screening, adequate intake of calcium and vitamin D, diet and exercise return annually or prn  An After Visit Summary was printed and given to the patient.

## 2016-01-10 LAB — HEPATITIS C ANTIBODY: HCV AB: NEGATIVE

## 2016-01-10 LAB — VITAMIN D 25 HYDROXY (VIT D DEFICIENCY, FRACTURES): VIT D 25 HYDROXY: 63 ng/mL (ref 30–100)

## 2016-01-14 NOTE — Progress Notes (Signed)
Encounter reviewed by Dr. Brook Amundson C. Silva.  

## 2016-10-11 ENCOUNTER — Other Ambulatory Visit: Payer: Self-pay | Admitting: Family Medicine

## 2016-10-11 DIAGNOSIS — Z1231 Encounter for screening mammogram for malignant neoplasm of breast: Secondary | ICD-10-CM

## 2016-11-12 ENCOUNTER — Ambulatory Visit
Admission: RE | Admit: 2016-11-12 | Discharge: 2016-11-12 | Disposition: A | Discharge status: Home / Self Care | Payer: Medicare Other | Source: Ambulatory Visit | Attending: Family Medicine | Admitting: Family Medicine

## 2016-11-12 DIAGNOSIS — Z1231 Encounter for screening mammogram for malignant neoplasm of breast: Secondary | ICD-10-CM

## 2016-11-14 ENCOUNTER — Other Ambulatory Visit: Payer: Self-pay | Admitting: Family Medicine

## 2016-11-14 DIAGNOSIS — R928 Other abnormal and inconclusive findings on diagnostic imaging of breast: Secondary | ICD-10-CM

## 2016-11-20 ENCOUNTER — Ambulatory Visit
Admission: RE | Admit: 2016-11-20 | Discharge: 2016-11-20 | Disposition: A | Discharge status: Home / Self Care | Payer: Medicare Other | Source: Ambulatory Visit | Attending: Family Medicine | Admitting: Family Medicine

## 2016-11-20 DIAGNOSIS — R928 Other abnormal and inconclusive findings on diagnostic imaging of breast: Secondary | ICD-10-CM

## 2017-01-13 ENCOUNTER — Encounter: Payer: Self-pay | Admitting: Nurse Practitioner

## 2017-01-13 ENCOUNTER — Ambulatory Visit (INDEPENDENT_AMBULATORY_CARE_PROVIDER_SITE_OTHER): Payer: Medicare Other | Admitting: Nurse Practitioner

## 2017-01-13 VITALS — BP 112/58 | HR 76 | Resp 16 | Ht 62.0 in | Wt 118.8 lb

## 2017-01-13 DIAGNOSIS — E559 Vitamin D deficiency, unspecified: Secondary | ICD-10-CM

## 2017-01-13 DIAGNOSIS — Z01411 Encounter for gynecological examination (general) (routine) with abnormal findings: Secondary | ICD-10-CM | POA: Diagnosis not present

## 2017-01-13 DIAGNOSIS — Z0000 Encounter for general adult medical examination without abnormal findings: Secondary | ICD-10-CM

## 2017-01-13 DIAGNOSIS — R3915 Urgency of urination: Secondary | ICD-10-CM

## 2017-01-13 DIAGNOSIS — N760 Acute vaginitis: Secondary | ICD-10-CM

## 2017-01-13 DIAGNOSIS — E039 Hypothyroidism, unspecified: Secondary | ICD-10-CM | POA: Diagnosis not present

## 2017-01-13 DIAGNOSIS — N952 Postmenopausal atrophic vaginitis: Secondary | ICD-10-CM | POA: Diagnosis not present

## 2017-01-13 DIAGNOSIS — E2839 Other primary ovarian failure: Secondary | ICD-10-CM

## 2017-01-13 LAB — POCT URINALYSIS DIPSTICK
BILIRUBIN UA: NEGATIVE
GLUCOSE UA: NEGATIVE
KETONES UA: NEGATIVE
LEUKOCYTES UA: NEGATIVE
Nitrite, UA: NEGATIVE
PH UA: 5 (ref 5.0–8.0)
Protein, UA: NEGATIVE
Urobilinogen, UA: 0.2 E.U./dL

## 2017-01-13 MED ORDER — NYSTATIN 100000 UNIT/GM EX CREA: 1.0000 "application " | TOPICAL_CREAM | Freq: Two times a day (BID) | CUTANEOUS | 0 refills | Status: DC

## 2017-01-13 MED ORDER — TRIAMCINOLONE ACETONIDE 0.025 % EX OINT: 1.0000 "application " | TOPICAL_OINTMENT | Freq: Two times a day (BID) | CUTANEOUS | 0 refills | Status: DC

## 2017-01-13 NOTE — Progress Notes (Signed)
66 y.o. G2P2002 Widowed  Caucasian Fe here for annual exam.  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 Has several other areas on face and right breast that are being treated with Efudex.  She has been working a lot in the yard and has noted an increased in urine urgency and vaginal irritation.  Denies stress incontinence.  Off HRT about 02/2014. Not SA since a year before husband passed in 2008.    Patient's last menstrual period was 09/10/2003.          Sexually active: No.  The current method of family planning is post menopausal status.    Exercising: Yes.    Walk, Yard Work Smoker:  no  Health Maintenance: Pap: 12/16/13, Negative with neg HR HPV; 10/31/11 Neg: neg HR HPV MMG: 11/13/16 BiRAD-0, Density C[facility]; 11/20/16 Dx & U/S R Breast [place] Self Breast exams: Yes Colonoscopy:  10/19/2014 Polyps. Repeat in 5 years BMD:  09/28/2014 Osteopenia -1.4/-1.1/-1.1- (done about 6 months after stopping HRT) TDaP: PCP Shingles: 12/07/16, 2nd injection due this month Pneumonia: Prevnar 13, Fall 2016 Hep C: 01/09/16 Neg Labs: PCP                  Urine: Tace RBC     reports that she has never smoked. She has never used smokeless tobacco. She reports that she does not drink alcohol or use drugs.  Past Medical History:  Diagnosis Date  . ADHD (attention deficit hyperactivity disorder)   . GERD (gastroesophageal reflux disease)   . Hyperlipidemia   . Hypokalemia   . PMB (postmenopausal bleeding) 01/2002   endo polyp  . Thyroid disease 2002   hypothyroidism    Past Surgical History:  Procedure Laterality Date  . CHOLECYSTECTOMY, LAPAROSCOPIC  06/07/11  . HYSTEROSCOPY  01/2002   endo polyp and focal simple hyperplasia  . POPLITEAL SYNOVIAL CYST EXCISION Left 2008 and 2010  . TONSILLECTOMY      Current Outpatient Prescriptions  Medication Sig Dispense Refill  . ACETAMINOPHEN-BUTALBITAL (BUPAP) 50-650 MG TABS Take 50-650 mg by mouth every 4 (four) hours.      Marland Kitchen  amphetamine-dextroamphetamine (ADDERALL XR, 30MG,) 30 MG 24 hr capsule Take 30 mg by mouth every morning.      Marland Kitchen aspirin 81 MG tablet Take 81 mg by mouth daily.      Marland Kitchen b complex vitamins capsule Take 1 capsule by mouth daily.    . Biotin (BIOTIN 5000) 5 MG CAPS Take by mouth.    Marland Kitchen buPROPion (WELLBUTRIN XL) 300 MG 24 hr tablet Take 300 mg by mouth.    . cholecalciferol (VITAMIN D) 1000 UNITS tablet Take 1,000 Units by mouth daily.      Marland Kitchen conjugated estrogens (PREMARIN) vaginal cream Use 1/2 g vaginally twice weekly 42.5 g 3  . cycloSPORINE (RESTASIS) 0.05 % ophthalmic emulsion Place 1 drop into both eyes 2 (two) times daily.    . fluticasone (FLONASE) 50 MCG/ACT nasal spray 1-2 sprays in each nostril 1-2 times daily    . folic acid (FOLVITE) 161 MCG tablet Take 400 mcg by mouth daily.      . Ginkgo Biloba 120 MG CAPS Take 120 mg by mouth.      . hydrochlorothiazide 25 MG tablet Take 25 mg by mouth daily.      . Melatonin 5 MG CAPS Take 8 mg by mouth at bedtime.     . Multiple Vitamin (MULTIVITAMIN) capsule Take 1 capsule by mouth daily.      Marland Kitchen  thyroid (ARMOUR THYROID) 15 MG tablet Take 1 tablet by mouth daily.    Marland Kitchen thyroid (ARMOUR) 30 MG tablet Take 30 mg by mouth daily before breakfast.     No current facility-administered medications for this visit.     Family History  Problem Relation Age of Onset  . Cancer Mother     multiple myeloma  . Cancer Father     oral  . Diabetes Father   . Diabetes Brother   . Heart disease Brother 52    secondary to drug use  . Diabetes Paternal Grandmother   . Osteoporosis      ROS:  Pertinent items are noted in HPI.  Otherwise, a comprehensive ROS was negative.  Exam:   BP (!) 112/58 (BP Location: Right Arm, Patient Position: Sitting, Cuff Size: Normal)   Pulse 76   Resp 16   Ht '5\' 2"'$  (1.575 m)   Wt 118 lb 12.8 oz (53.9 kg)   LMP 09/10/2003   BMI 21.73 kg/m  Height: '5\' 2"'$  (157.5 cm) Ht Readings from Last 3 Encounters:  01/13/17 '5\' 2"'$   (1.575 m)  01/09/16 '5\' 2"'$  (1.575 m)  12/20/14 5' 2.25" (1.581 m)    General appearance: alert, cooperative and appears stated age Head: Normocephalic, without obvious abnormality, atraumatic Neck: no adenopathy, supple, symmetrical, trachea midline and thyroid normal to inspection and palpation Lungs: clear to auscultation bilaterally Breasts: normal appearance, no masses or tenderness Heart: regular rate and rhythm Abdomen: soft, non-tender; no masses,  no organomegaly Extremities: extremities normal, atraumatic, no cyanosis or edema Skin: Skin color, texture, turgor normal. No rashes or lesions Lymph nodes: Cervical, supraclavicular, and axillary nodes normal. No abnormal inguinal nodes palpated Neurologic: Grossly normal   Pelvic: External genitalia:  Some redness from the labia to the anus with some scaling most consistent with topical yeast              Urethra:  normal appearing urethra with no masses, tenderness or lesions              Bartholin's and Skene's: normal                 Vagina: normal appearing vagina with normal color and discharge, no lesions              Cervix: anteverted              Pap taken: Yes.   Bimanual Exam:  Uterus:  normal size, contour, position, consistency, mobility, non-tender              Adnexa: no mass, fullness, tenderness               Rectovaginal: Confirms               Anus:  normal sphincter tone, no lesions  Chaperone present: yes  A:  Well Woman with normal exam      Postmenopausal on HRT since 12/96 -03/2014 Atrophic vaginitis off Premarin @ this time Osteopenia - mild at the spine Hypothyroid -  sees Endocrinologist History of colonic tubular adenoma History of hyperlipidemia  History of ADD, GERD; Herpes Labialis  R/O UTI or vaginitis  P:   Reviewed health and wellness pertinent to exam  Pap smear: yes  Mammogram is due 3/20119, order sent for BMD at [facility]  Follow with urine, wet prep and labs  Counseled on breast self exam, mammography screening, adequate intake of calcium and vitamin D, diet and exercise, Kegel's  exercises return annually or prn  An After Visit Summary was printed and given to the patient.

## 2017-01-13 NOTE — Patient Instructions (Signed)

## 2017-01-14 LAB — URINALYSIS, MICROSCOPIC ONLY
Bacteria, UA: NONE SEEN [HPF]
CRYSTALS: NONE SEEN [HPF]
Casts: NONE SEEN [LPF]
Squamous Epithelial / LPF: NONE SEEN [HPF] (ref <=5)
WBC UA: NONE SEEN WBC/HPF (ref <=5)
Yeast: NONE SEEN [HPF]

## 2017-01-14 LAB — IPS PAP SMEAR ONLY

## 2017-01-14 LAB — WET PREP BY MOLECULAR PROBE
Candida species: NOT DETECTED
Gardnerella vaginalis: NOT DETECTED
Trichomonas vaginosis: NOT DETECTED

## 2017-01-14 LAB — VITAMIN D 25 HYDROXY (VIT D DEFICIENCY, FRACTURES): VIT D 25 HYDROXY: 60 ng/mL (ref 30–100)

## 2017-01-14 NOTE — Progress Notes (Signed)
Encounter reviewed by Dr. Brook Amundson C. Silva.  

## 2017-01-15 LAB — URINE CULTURE: ORGANISM ID, BACTERIA: NO GROWTH

## 2017-01-20 ENCOUNTER — Telehealth: Payer: Self-pay | Admitting: Nurse Practitioner

## 2017-01-20 NOTE — Telephone Encounter (Signed)
Spoke with patient. Advised of results as seen below from Kem Boroughs, [place]. Patient is agreeable and verbalizes understanding.  Written by Kem Boroughs, [place] on 01/15/2017 7:53 AM  Catherine Peterson, The Vit D was very good at 70, continue your OTC dose. The urine micro did not show any red blood cells but the culture is not back. Will send those results later. The vaginal wet prep was negative for bacterial and yeast infection, but still use the combination cream for external symptoms.  Catherine Peterson, The urine culture was negative. The pap smear was normal.   Routing to provider for final review. Patient agreeable to disposition. Will close encounter.

## 2017-01-20 NOTE — Telephone Encounter (Signed)
Patient calling for results because she is locked out of Mychart.

## 2017-04-08 ENCOUNTER — Telehealth: Payer: Self-pay | Admitting: Obstetrics & Gynecology

## 2017-04-08 NOTE — Telephone Encounter (Signed)
Left message on voicemail to call and reschedule cancelled appointment. Mail letter °

## 2017-06-03 ENCOUNTER — Other Ambulatory Visit: Payer: Self-pay | Admitting: Obstetrics and Gynecology

## 2017-06-04 ENCOUNTER — Other Ambulatory Visit: Payer: Self-pay | Admitting: Family Medicine

## 2017-06-04 DIAGNOSIS — E2839 Other primary ovarian failure: Secondary | ICD-10-CM

## 2017-06-06 ENCOUNTER — Ambulatory Visit
Admission: RE | Admit: 2017-06-06 | Discharge: 2017-06-06 | Disposition: A | Discharge status: Home / Self Care | Payer: Medicare Other | Source: Ambulatory Visit | Attending: Family Medicine | Admitting: Family Medicine

## 2017-06-06 DIAGNOSIS — E2839 Other primary ovarian failure: Secondary | ICD-10-CM

## 2017-08-13 ENCOUNTER — Other Ambulatory Visit: Payer: Self-pay | Admitting: Orthopedic Surgery

## 2017-09-15 ENCOUNTER — Other Ambulatory Visit: Payer: Self-pay

## 2017-09-15 ENCOUNTER — Ambulatory Visit: Payer: Medicare Other | Admitting: Obstetrics and Gynecology

## 2017-09-15 ENCOUNTER — Encounter: Payer: Self-pay | Admitting: Obstetrics and Gynecology

## 2017-09-15 ENCOUNTER — Telehealth: Payer: Self-pay | Admitting: Obstetrics & Gynecology

## 2017-09-15 VITALS — BP 158/80 | HR 108 | Resp 18 | Wt 118.0 lb

## 2017-09-15 DIAGNOSIS — R140 Abdominal distension (gaseous): Secondary | ICD-10-CM | POA: Diagnosis not present

## 2017-09-15 DIAGNOSIS — R350 Frequency of micturition: Secondary | ICD-10-CM | POA: Diagnosis not present

## 2017-09-15 LAB — POCT URINALYSIS DIPSTICK
APPEARANCE: NORMAL
Bilirubin, UA: NEGATIVE
GLUCOSE UA: NEGATIVE
Ketones, UA: NEGATIVE
LEUKOCYTES UA: NEGATIVE
Nitrite, UA: NEGATIVE
PROTEIN UA: NEGATIVE
RBC UA: NEGATIVE
Urobilinogen, UA: NEGATIVE E.U./dL — AB
pH, UA: 6 (ref 5.0–8.0)

## 2017-09-15 NOTE — Telephone Encounter (Signed)
Patient came into the office states she has a "huge growth" that developed over the weekend and wants to be seen today.  Per [facility] patient scheduled with Dr Talbert Nan today at 2:30pm

## 2017-09-15 NOTE — Progress Notes (Signed)
GYNECOLOGY  VISIT   HPI: 67 y.o.   Widowed  Caucasian  female   G2P2002 with Patient's last menstrual period was 09/10/2003.   here c/o sudden swelling and pain in LLQ, started Saturday morning. She had diarrhea Saturday night, normal BM last few days. Tender to palpation. No baseline abdominal pain. She has had some mid back and her side, hurts to move a certain way. No fevers, no chills. Slight nausea prior to BM, off and on for a couple of years. No vomiting. She feels slight bladder pressure in the last few days. Voiding small amounts. Slight frequency. No vaginal bleeding, no vaginal discharge.   GYNECOLOGIC HISTORY: Patient's last menstrual period was 09/10/2003. Contraception:postmenopause  Menopausal hormone therapy: none         OB History    Gravida Para Term Preterm AB Living   2 2 2  0 0 2   SAB TAB Ectopic Multiple Live Births   0 0 0 0 2         Patient Active Problem List   Diagnosis Date Noted  . Osteopenia 12/16/2013  . Postmenopausal hormone replacement therapy 12/16/2013  . Post menopausal syndrome 12/16/2013  . Chronic cholecystitis with calculus 05/21/2011    Past Medical History:  Diagnosis Date  . ADHD (attention deficit hyperactivity disorder)   . Basal cell carcinoma    Leg and back of R arm   . GERD (gastroesophageal reflux disease)   . Hyperlipidemia   . Hypokalemia   . PMB (postmenopausal bleeding) 01/2002   endo polyp  . Thyroid disease 2002   hypothyroidism    Past Surgical History:  Procedure Laterality Date  . CHOLECYSTECTOMY, LAPAROSCOPIC  06/07/11  . HYSTEROSCOPY  01/2002   endo polyp and focal simple hyperplasia  . POPLITEAL SYNOVIAL CYST EXCISION Left 2008 and 2010  . TONSILLECTOMY      Current Outpatient Medications  Medication Sig Dispense Refill  . ACETAMINOPHEN-BUTALBITAL (BUPAP) 50-650 MG TABS Take 50-650 mg by mouth every 4 (four) hours.      Marland Kitchen amphetamine-dextroamphetamine (ADDERALL XR, 30MG,) 30 MG 24 hr capsule Take 30  mg by mouth every morning.      Marland Kitchen aspirin 81 MG tablet Take 81 mg by mouth daily.      Marland Kitchen b complex vitamins capsule Take 1 capsule by mouth daily.    . Biotin (BIOTIN 5000) 5 MG CAPS Take by mouth.    Marland Kitchen buPROPion (WELLBUTRIN XL) 300 MG 24 hr tablet Take 300 mg by mouth.    . cholecalciferol (VITAMIN D) 1000 UNITS tablet Take 1,000 Units by mouth daily.      . cycloSPORINE (RESTASIS) 0.05 % ophthalmic emulsion Place 1 drop into both eyes 2 (two) times daily.    . fluticasone (FLONASE) 50 MCG/ACT nasal spray 1-2 sprays in each nostril 1-2 times daily    . folic acid (FOLVITE) 017 MCG tablet Take 400 mcg by mouth daily.      . Ginkgo Biloba 120 MG CAPS Take 120 mg by mouth.      . hydrochlorothiazide 25 MG tablet Take 25 mg by mouth daily.      . Melatonin 5 MG CAPS Take 8 mg by mouth at bedtime.     . Multiple Vitamin (MULTIVITAMIN) capsule Take 1 capsule by mouth daily.      Marland Kitchen thyroid (ARMOUR THYROID) 15 MG tablet Take 1 tablet by mouth daily.    Marland Kitchen thyroid (ARMOUR) 30 MG tablet Take 30 mg by mouth daily before  breakfast.     No current facility-administered medications for this visit.      ALLERGIES: Codeine  Family History  Problem Relation Age of Onset  . Cancer Mother        multiple myeloma  . Cancer Father        oral  . Diabetes Father   . Diabetes Brother   . Heart disease Brother 60       secondary to drug use  . Diabetes Paternal Grandmother   . Osteoporosis Unknown     Social History   Socioeconomic History  . Marital status: Widowed    Spouse name: Not on file  . Number of children: 2  . Years of education: Not on file  . Highest education level: Not on file  Social Needs  . Financial resource strain: Not on file  . Food insecurity - worry: Not on file  . Food insecurity - inability: Not on file  . Transportation needs - medical: Not on file  . Transportation needs - non-medical: Not on file  Occupational History  . Not on file  Tobacco Use  . Smoking  status: Never Smoker  . Smokeless tobacco: Never Used  Substance and Sexual Activity  . Alcohol use: No  . Drug use: No  . Sexual activity: No    Partners: Male    Birth control/protection: Post-menopausal  Other Topics Concern  . Not on file  Social History Narrative   Husband died 2006-11-12. Not working outside the home.    Review of Systems  Constitutional: Negative.   HENT: Positive for sinus pain.   Eyes: Negative.   Respiratory: Negative.   Cardiovascular: Positive for leg swelling.  Gastrointestinal: Positive for constipation and diarrhea.       Bloating  Genitourinary:       LLQ pain and swelling  Musculoskeletal: Negative.   Skin: Negative.   Neurological: Positive for headaches.  Endo/Heme/Allergies: Negative.   Psychiatric/Behavioral: Negative.     PHYSICAL EXAMINATION:    BP (!) 158/80 (BP Location: Right Arm, Patient Position: Sitting, Cuff Size: Normal)   Pulse (!) 108   Resp 18   Wt 118 lb (53.5 kg)   LMP 09/10/2003   BMI 21.58 kg/m     General appearance: alert, cooperative and appears stated age Abdomen: soft, mildly tender in the LLQ over the area of the descending colon, no masses,  no organomegaly, NABS  Pelvic: External genitalia:  no lesions              Urethra:  normal appearing urethra with no masses, tenderness or lesions              Bartholins and Skenes: normal                 Vagina: normal appearing atrophic vagina with normal color and discharge, no lesions              Cervix: no cervical motion tenderness and no lesions              Bimanual Exam:  Uterus:  normal size, contour, position, consistency, mobility, non-tender and anteverted              Adnexa: no masses, mildly tender in the left adnexal region              Rectovaginal: Yes.  .  Confirms.              Anus:  normal sphincter tone, no  lesions  Chaperone was present for exam.  ASSESSMENT Abdominal distention, normal pelvic exam Slight urinary frequency    PLAN She  will try gas-ex or beano Discussed that certain foods can cause distension She will follow up with her primary if her symptoms don't improve Urine dip negative, call with worsening symptoms   An After Visit Summary was printed and given to the patient.

## 2017-09-15 NOTE — Patient Instructions (Signed)
Abdominal Bloating °When you have abdominal bloating, your abdomen may feel full, tight, or painful. It may also look bigger than normal or swollen (distended). Common causes of abdominal bloating include: °· Swallowing air. °· Constipation. °· Problems digesting food. °· Eating too much. °· Irritable bowel syndrome. This is a condition that affects the large intestine. °· Lactose intolerance. This is an inability to digest lactose, a natural sugar in dairy products. °· Celiac disease. This is a condition that affects the ability to digest gluten, a protein found in some grains. °· Gastroparesis. This is a condition that slows down the movement of food in the stomach and small intestine. It is more common in people with diabetes mellitus. °· Gastroesophageal reflux disease (GERD). This is a digestive condition that makes stomach acid flow back into the esophagus. °· Urinary retention. This means that the body is holding onto urine, and the bladder cannot be emptied all the way. ° °Follow these instructions at home: °Eating and drinking °· Avoid eating too much. °· Try not to swallow air while talking or eating. °· Avoid eating while lying down. °· Avoid these foods and drinks: °? Foods that cause gas, such as broccoli, cabbage, cauliflower, and baked beans. °? Carbonated drinks. °? Hard candy. °? Chewing gum. °Medicines °· Take over-the-counter and prescription medicines only as told by your health care provider. °· Take probiotic medicines. These medicines contain live bacteria or yeasts that can help digestion. °· Take coated peppermint oil capsules. °Activity °· Try to exercise regularly. Exercise may help to relieve bloating that is caused by gas and relieve constipation. °General instructions °· Keep all follow-up visits as told by your health care provider. This is important. °Contact a health care provider if: °· You have nausea and vomiting. °· You have diarrhea. °· You have abdominal pain. °· You have  unusual weight loss or weight gain. °· You have severe pain, and medicines do not help. °Get help right away if: °· You have severe chest pain. °· You have trouble breathing. °· You have shortness of breath. °· You have trouble urinating. °· You have darker urine than normal. °· You have blood in your stools or have dark, tarry stools. °Summary °· Abdominal bloating means that the abdomen is swollen. °· Common causes of abdominal bloating are swallowing air, constipation, and problems digesting food. °· Avoid eating too much and avoid swallowing air. °· Avoid foods that cause gas, carbonated drinks, hard candy, and chewing gum. °This information is not intended to replace advice given to you by your health care provider. Make sure you discuss any questions you have with your health care provider. °Document Released: 09/27/2016 Document Revised: 09/27/2016 Document Reviewed: 09/27/2016 °Elsevier Interactive Patient Education © 2018 [employer] ° °

## 2017-09-18 ENCOUNTER — Encounter (HOSPITAL_BASED_OUTPATIENT_CLINIC_OR_DEPARTMENT_OTHER): Payer: Self-pay | Admitting: *Deleted

## 2017-09-19 ENCOUNTER — Encounter (HOSPITAL_BASED_OUTPATIENT_CLINIC_OR_DEPARTMENT_OTHER)
Admission: RE | Admit: 2017-09-19 | Discharge: 2017-09-19 | Disposition: A | Discharge status: Home / Self Care | Payer: Medicare Other | Source: Ambulatory Visit | Attending: Orthopedic Surgery | Admitting: Orthopedic Surgery

## 2017-09-19 DIAGNOSIS — Z01812 Encounter for preprocedural laboratory examination: Secondary | ICD-10-CM | POA: Insufficient documentation

## 2017-09-19 LAB — BASIC METABOLIC PANEL
Anion gap: 8 (ref 5–15)
BUN: 12 mg/dL (ref 6–20)
CALCIUM: 9 mg/dL (ref 8.9–10.3)
CO2: 29 mmol/L (ref 22–32)
CREATININE: 0.89 mg/dL (ref 0.44–1.00)
Chloride: 101 mmol/L (ref 101–111)
GFR calc Af Amer: >60 mL/min (ref >60)
GFR calc non Af Amer: >60 mL/min (ref >60)
GLUCOSE: 92 mg/dL (ref 65–99)
Potassium: 3.6 mmol/L (ref 3.5–5.1)
Sodium: 138 mmol/L (ref 135–145)

## 2017-09-22 ENCOUNTER — Other Ambulatory Visit: Payer: Self-pay | Admitting: Physician Assistant

## 2017-09-22 DIAGNOSIS — R1032 Left lower quadrant pain: Secondary | ICD-10-CM

## 2017-09-22 DIAGNOSIS — R140 Abdominal distension (gaseous): Secondary | ICD-10-CM

## 2017-09-23 ENCOUNTER — Ambulatory Visit
Admission: RE | Admit: 2017-09-23 | Discharge: 2017-09-23 | Disposition: A | Discharge status: Home / Self Care | Payer: Medicare Other | Source: Ambulatory Visit | Attending: Physician Assistant | Admitting: Physician Assistant

## 2017-09-23 DIAGNOSIS — R1032 Left lower quadrant pain: Secondary | ICD-10-CM

## 2017-09-23 DIAGNOSIS — R140 Abdominal distension (gaseous): Secondary | ICD-10-CM

## 2017-09-23 MED ORDER — IOPAMIDOL (ISOVUE-300) INJECTION 61%
100.0000 mL | Freq: Once | INTRAVENOUS | Status: AC | PRN
  Administered 2017-09-23: 100 mL via INTRAVENOUS

## 2017-09-25 ENCOUNTER — Encounter (HOSPITAL_BASED_OUTPATIENT_CLINIC_OR_DEPARTMENT_OTHER)
Admission: RE | Disposition: A | Discharge status: Home / Self Care | Payer: Self-pay | Source: Ambulatory Visit | Attending: Orthopedic Surgery

## 2017-09-25 ENCOUNTER — Ambulatory Visit (HOSPITAL_BASED_OUTPATIENT_CLINIC_OR_DEPARTMENT_OTHER): Payer: Medicare Other | Admitting: Anesthesiology

## 2017-09-25 ENCOUNTER — Other Ambulatory Visit: Payer: Self-pay

## 2017-09-25 ENCOUNTER — Ambulatory Visit (HOSPITAL_COMMUNITY)
Admission: RE | Admit: 2017-09-25 | Discharge: 2017-09-25 | Disposition: A | Discharge status: Home / Self Care | Payer: Medicare Other | Source: Ambulatory Visit | Attending: Orthopedic Surgery | Admitting: Orthopedic Surgery

## 2017-09-25 ENCOUNTER — Encounter (HOSPITAL_BASED_OUTPATIENT_CLINIC_OR_DEPARTMENT_OTHER): Payer: Self-pay | Admitting: Anesthesiology

## 2017-09-25 DIAGNOSIS — Z79899 Other long term (current) drug therapy: Secondary | ICD-10-CM | POA: Diagnosis not present

## 2017-09-25 DIAGNOSIS — Z85828 Personal history of other malignant neoplasm of skin: Secondary | ICD-10-CM | POA: Diagnosis not present

## 2017-09-25 DIAGNOSIS — K219 Gastro-esophageal reflux disease without esophagitis: Secondary | ICD-10-CM | POA: Insufficient documentation

## 2017-09-25 DIAGNOSIS — E039 Hypothyroidism, unspecified: Secondary | ICD-10-CM | POA: Diagnosis not present

## 2017-09-25 DIAGNOSIS — F909 Attention-deficit hyperactivity disorder, unspecified type: Secondary | ICD-10-CM | POA: Diagnosis not present

## 2017-09-25 DIAGNOSIS — Z9889 Other specified postprocedural states: Secondary | ICD-10-CM

## 2017-09-25 DIAGNOSIS — M7741 Metatarsalgia, right foot: Secondary | ICD-10-CM | POA: Diagnosis not present

## 2017-09-25 DIAGNOSIS — M2041 Other hammer toe(s) (acquired), right foot: Secondary | ICD-10-CM | POA: Diagnosis not present

## 2017-09-25 DIAGNOSIS — E785 Hyperlipidemia, unspecified: Secondary | ICD-10-CM | POA: Insufficient documentation

## 2017-09-25 DIAGNOSIS — F329 Major depressive disorder, single episode, unspecified: Secondary | ICD-10-CM | POA: Diagnosis not present

## 2017-09-25 DIAGNOSIS — F419 Anxiety disorder, unspecified: Secondary | ICD-10-CM | POA: Insufficient documentation

## 2017-09-25 DIAGNOSIS — Z885 Allergy status to narcotic agent status: Secondary | ICD-10-CM | POA: Insufficient documentation

## 2017-09-25 DIAGNOSIS — Z7982 Long term (current) use of aspirin: Secondary | ICD-10-CM | POA: Diagnosis not present

## 2017-09-25 HISTORY — PX: HAMMERTOE RECONSTRUCTION WITH WEIL OSTEOTOMY: SHX5631

## 2017-09-25 SURGERY — HAMMERTOE RECONSTRUCTION WITH WEIL OSTEOTOMY
Anesthesia: General | Site: Foot | Laterality: Right

## 2017-09-25 MED ORDER — DEXAMETHASONE SODIUM PHOSPHATE 10 MG/ML IJ SOLN
INTRAMUSCULAR | Status: AC
  Filled 2017-09-25: qty 1

## 2017-09-25 MED ORDER — ACETAMINOPHEN 160 MG/5ML PO SOLN: 325.0000 mg | ORAL | Status: DC | PRN

## 2017-09-25 MED ORDER — PROPOFOL 10 MG/ML IV BOLUS
INTRAVENOUS | Status: DC | PRN
  Administered 2017-09-25: 150 mg via INTRAVENOUS

## 2017-09-25 MED ORDER — ONDANSETRON HCL 4 MG/2ML IJ SOLN
INTRAMUSCULAR | Status: DC | PRN
  Administered 2017-09-25: 4 mg via INTRAVENOUS

## 2017-09-25 MED ORDER — ONDANSETRON HCL 4 MG/2ML IJ SOLN: 4.0000 mg | Freq: Once | INTRAMUSCULAR | Status: DC | PRN

## 2017-09-25 MED ORDER — ONDANSETRON HCL 4 MG/2ML IJ SOLN
INTRAMUSCULAR | Status: AC
  Filled 2017-09-25: qty 2

## 2017-09-25 MED ORDER — FENTANYL CITRATE (PF) 100 MCG/2ML IJ SOLN
INTRAMUSCULAR | Status: AC
  Filled 2017-09-25: qty 2

## 2017-09-25 MED ORDER — CEFAZOLIN SODIUM-DEXTROSE 2-4 GM/100ML-% IV SOLN
2.0000 g | INTRAVENOUS | Status: AC
  Administered 2017-09-25: 2 g via INTRAVENOUS

## 2017-09-25 MED ORDER — MIDAZOLAM HCL 2 MG/2ML IJ SOLN
1.0000 mg | INTRAMUSCULAR | Status: DC | PRN
  Administered 2017-09-25: 1 mg via INTRAVENOUS

## 2017-09-25 MED ORDER — 0.9 % SODIUM CHLORIDE (POUR BTL) OPTIME
TOPICAL | Status: DC | PRN
  Administered 2017-09-25: 1000 mL

## 2017-09-25 MED ORDER — OXYCODONE HCL 5 MG PO TABS: 5.0000 mg | ORAL_TABLET | ORAL | 0 refills | Status: DC | PRN

## 2017-09-25 MED ORDER — MIDAZOLAM HCL 5 MG/5ML IJ SOLN
INTRAMUSCULAR | Status: DC | PRN
  Administered 2017-09-25: 2 mg via INTRAVENOUS

## 2017-09-25 MED ORDER — ROPIVACAINE HCL 5 MG/ML IJ SOLN
INTRAMUSCULAR | Status: DC | PRN
  Administered 2017-09-25 (×6): 5 mL via PERINEURAL

## 2017-09-25 MED ORDER — LIDOCAINE 2% (20 MG/ML) 5 ML SYRINGE
INTRAMUSCULAR | Status: AC
  Filled 2017-09-25: qty 5

## 2017-09-25 MED ORDER — FENTANYL CITRATE (PF) 100 MCG/2ML IJ SOLN
INTRAMUSCULAR | Status: DC | PRN
  Administered 2017-09-25: 100 ug via INTRAVENOUS
  Administered 2017-09-25: 25 ug via INTRAVENOUS

## 2017-09-25 MED ORDER — MEPERIDINE HCL 25 MG/ML IJ SOLN: 6.2500 mg | INTRAMUSCULAR | Status: DC | PRN

## 2017-09-25 MED ORDER — MIDAZOLAM HCL 2 MG/2ML IJ SOLN
INTRAMUSCULAR | Status: AC
  Filled 2017-09-25: qty 2

## 2017-09-25 MED ORDER — KETOROLAC TROMETHAMINE 30 MG/ML IJ SOLN: 30.0000 mg | Freq: Once | INTRAMUSCULAR | Status: DC | PRN

## 2017-09-25 MED ORDER — FENTANYL CITRATE (PF) 100 MCG/2ML IJ SOLN
50.0000 ug | INTRAMUSCULAR | Status: DC | PRN
  Administered 2017-09-25: 50 ug via INTRAVENOUS

## 2017-09-25 MED ORDER — FENTANYL CITRATE (PF) 100 MCG/2ML IJ SOLN: 25.0000 ug | INTRAMUSCULAR | Status: DC | PRN

## 2017-09-25 MED ORDER — LACTATED RINGERS IV SOLN
INTRAVENOUS | Status: DC
  Administered 2017-09-25 (×2): via INTRAVENOUS

## 2017-09-25 MED ORDER — ACETAMINOPHEN 325 MG PO TABS: 325.0000 mg | ORAL_TABLET | ORAL | Status: DC | PRN

## 2017-09-25 MED ORDER — PROPOFOL 10 MG/ML IV BOLUS
INTRAVENOUS | Status: AC
  Filled 2017-09-25: qty 20

## 2017-09-25 MED ORDER — SODIUM CHLORIDE 0.9 % IV SOLN: INTRAVENOUS | Status: DC

## 2017-09-25 MED ORDER — CEFAZOLIN SODIUM-DEXTROSE 2-4 GM/100ML-% IV SOLN
INTRAVENOUS | Status: AC
  Filled 2017-09-25: qty 100

## 2017-09-25 MED ORDER — DOCUSATE SODIUM 100 MG PO CAPS: 100.0000 mg | ORAL_CAPSULE | Freq: Two times a day (BID) | ORAL | 0 refills | Status: DC

## 2017-09-25 MED ORDER — ONDANSETRON HCL 4 MG PO TABS: 4.0000 mg | ORAL_TABLET | Freq: Every day | ORAL | 0 refills | Status: DC | PRN

## 2017-09-25 MED ORDER — CHLORHEXIDINE GLUCONATE 4 % EX LIQD: 60.0000 mL | Freq: Once | CUTANEOUS | Status: DC

## 2017-09-25 MED ORDER — LIDOCAINE HCL (CARDIAC) 20 MG/ML IV SOLN
INTRAVENOUS | Status: DC | PRN
  Administered 2017-09-25: 30 mg via INTRAVENOUS

## 2017-09-25 MED ORDER — SCOPOLAMINE 1 MG/3DAYS TD PT72: 1.0000 | MEDICATED_PATCH | Freq: Once | TRANSDERMAL | Status: DC | PRN

## 2017-09-25 MED ORDER — DEXAMETHASONE SODIUM PHOSPHATE 10 MG/ML IJ SOLN
INTRAMUSCULAR | Status: DC | PRN
  Administered 2017-09-25: 10 mg via INTRAVENOUS

## 2017-09-25 MED ORDER — SENNA 8.6 MG PO TABS: 2.0000 | ORAL_TABLET | Freq: Two times a day (BID) | ORAL | 0 refills | Status: DC

## 2017-09-25 SURGICAL SUPPLY — 71 items
BANDAGE ESMARK 6X9 LF (GAUZE/BANDAGES/DRESSINGS) IMPLANT
BIT DRILL 1.8 CANN MAX VPC (BIT) ×1 IMPLANT
BLADE AVERAGE 25X9 (BLADE) IMPLANT
BLADE LONG MED 25X9 (BLADE) ×2 IMPLANT
BLADE OSC/SAG .038X5.5 CUT EDG (BLADE) IMPLANT
BLADE SURG 15 STRL LF DISP TIS (BLADE) ×2 IMPLANT
BLADE SURG 15 STRL SS (BLADE) ×4
BNDG CMPR 9X4 STRL LF SNTH (GAUZE/BANDAGES/DRESSINGS)
BNDG CMPR 9X6 STRL LF SNTH (GAUZE/BANDAGES/DRESSINGS)
BNDG COHESIVE 4X5 TAN STRL (GAUZE/BANDAGES/DRESSINGS) ×2 IMPLANT
BNDG CONFORM 2 STRL LF (GAUZE/BANDAGES/DRESSINGS) IMPLANT
BNDG CONFORM 3 STRL LF (GAUZE/BANDAGES/DRESSINGS) ×2 IMPLANT
BNDG ESMARK 4X9 LF (GAUZE/BANDAGES/DRESSINGS) IMPLANT
BNDG ESMARK 6X9 LF (GAUZE/BANDAGES/DRESSINGS)
CAP PIN PROTECTOR ORTHO WHT (CAP) IMPLANT
CHLORAPREP W/TINT 26ML (MISCELLANEOUS) ×2 IMPLANT
COVER BACK TABLE 60X90IN (DRAPES) ×2 IMPLANT
CUFF TOURNIQUET SINGLE 34IN LL (TOURNIQUET CUFF) IMPLANT
DRAPE EXTREMITY T 121X128X90 (DRAPE) ×2 IMPLANT
DRAPE OEC MINIVIEW 54X84 (DRAPES) ×2 IMPLANT
DRAPE U-SHAPE 47X51 STRL (DRAPES) ×2 IMPLANT
DRSG MEPILEX BORDER 4X8 (GAUZE/BANDAGES/DRESSINGS) ×1 IMPLANT
DRSG MEPITEL 4X7.2 (GAUZE/BANDAGES/DRESSINGS) ×2 IMPLANT
DRSG PAD ABDOMINAL 8X10 ST (GAUZE/BANDAGES/DRESSINGS) ×2 IMPLANT
ELECT REM PT RETURN 9FT ADLT (ELECTROSURGICAL) ×2
ELECTRODE REM PT RTRN 9FT ADLT (ELECTROSURGICAL) ×1 IMPLANT
GAUZE SPONGE 4X4 12PLY STRL (GAUZE/BANDAGES/DRESSINGS) ×2 IMPLANT
GLOVE BIO SURGEON STRL SZ8 (GLOVE) ×2 IMPLANT
GLOVE BIOGEL PI IND STRL 8 (GLOVE) ×2 IMPLANT
GLOVE BIOGEL PI INDICATOR 8 (GLOVE) ×2
GLOVE ECLIPSE 8.0 STRL XLNG CF (GLOVE) ×2 IMPLANT
GOWN STRL REUS W/ TWL LRG LVL3 (GOWN DISPOSABLE) ×1 IMPLANT
GOWN STRL REUS W/ TWL XL LVL3 (GOWN DISPOSABLE) ×2 IMPLANT
GOWN STRL REUS W/TWL LRG LVL3 (GOWN DISPOSABLE) ×2
GOWN STRL REUS W/TWL XL LVL3 (GOWN DISPOSABLE) ×4
K-WIRE .054X4 (WIRE) IMPLANT
K-WIRE COCR 0.9X95 (WIRE) ×2
KWIRE COCR 0.9X95 (WIRE) IMPLANT
NEEDLE HYPO 22GX1.5 SAFETY (NEEDLE) IMPLANT
NS IRRIG 1000ML POUR BTL (IV SOLUTION) ×2 IMPLANT
PACK BASIN DAY SURGERY FS (CUSTOM PROCEDURE TRAY) ×2 IMPLANT
PAD CAST 4YDX4 CTTN HI CHSV (CAST SUPPLIES) ×1 IMPLANT
PADDING CAST ABS 4INX4YD NS (CAST SUPPLIES)
PADDING CAST ABS COTTON 4X4 ST (CAST SUPPLIES) IMPLANT
PADDING CAST COTTON 4X4 STRL (CAST SUPPLIES) ×2
PASSER SUT SWANSON 36MM LOOP (INSTRUMENTS) IMPLANT
PENCIL BUTTON HOLSTER BLD 10FT (ELECTRODE) ×2 IMPLANT
SANITIZER HAND PURELL 535ML FO (MISCELLANEOUS) ×2 IMPLANT
SCREW COMP HEADLESS 2.5X18 (Screw) ×1 IMPLANT
SCREW HCS TWIST-OFF 2.0X10MM (Screw) ×1 IMPLANT
SCREW HCS TWIST-OFF 2.0X12MM (Screw) ×2 IMPLANT
SCREW MAX VPC  2.5X20 (Screw) ×2 IMPLANT
SCREW MAX VPC 2.5X20 (Screw) IMPLANT
SHEET MEDIUM DRAPE 40X70 STRL (DRAPES) ×2 IMPLANT
SLEEVE SCD COMPRESS KNEE MED (MISCELLANEOUS) ×2 IMPLANT
SPONGE LAP 18X18 X RAY DECT (DISPOSABLE) ×2 IMPLANT
STOCKINETTE 6  STRL (DRAPES) ×1
STOCKINETTE 6 STRL (DRAPES) ×1 IMPLANT
SUCTION FRAZIER HANDLE 10FR (MISCELLANEOUS) ×1
SUCTION TUBE FRAZIER 10FR DISP (MISCELLANEOUS) ×1 IMPLANT
SUT ETHILON 3 0 PS 1 (SUTURE) ×3 IMPLANT
SUT MNCRL AB 3-0 PS2 18 (SUTURE) ×2 IMPLANT
SUT VIC AB 2-0 SH 27 (SUTURE)
SUT VIC AB 2-0 SH 27XBRD (SUTURE) IMPLANT
SUT VICRYL 0 UR6 27IN ABS (SUTURE) IMPLANT
SYR BULB 3OZ (MISCELLANEOUS) ×2 IMPLANT
SYR CONTROL 10ML LL (SYRINGE) IMPLANT
TOWEL OR 17X24 6PK STRL BLUE (TOWEL DISPOSABLE) ×2 IMPLANT
TUBE CONNECTING 20X1/4 (TUBING) ×2 IMPLANT
UNDERPAD 30X30 (UNDERPADS AND DIAPERS) ×2 IMPLANT
YANKAUER SUCT BULB TIP NO VENT (SUCTIONS) IMPLANT

## 2017-09-25 NOTE — H&P (Signed)
Catherine Peterson is an 67 y.o. female.   Chief Complaint: right forefoot pain HPI:  67 y/o female without significant past medical history complains of a long history of right forefoot pain.  She has second third and fourth hammertoe deformities as well as metatarsalgia.  She has failed nonoperative treatment to date and presents today for surgical correction of these painful deformities.  Past Medical History:  Diagnosis Date  . ADHD (attention deficit hyperactivity disorder)   . Basal cell carcinoma    Leg and back of R arm   . GERD (gastroesophageal reflux disease)   . Hyperlipidemia   . Hypokalemia   . PMB (postmenopausal bleeding) 01/2002   endo polyp  . Thyroid disease 2002   hypothyroidism    Past Surgical History:  Procedure Laterality Date  . CHOLECYSTECTOMY, LAPAROSCOPIC  06/07/11  . HYSTEROSCOPY  01/2002   endo polyp and focal simple hyperplasia  . POPLITEAL SYNOVIAL CYST EXCISION Left 2008 and 2010  . TONSILLECTOMY      Family History  Problem Relation Age of Onset  . Cancer Mother        multiple myeloma  . Cancer Father        oral  . Diabetes Father   . Diabetes Brother   . Heart disease Brother 44       secondary to drug use  . Diabetes Paternal Grandmother   . Osteoporosis Unknown    Social History:  reports that  has never smoked. she has never used smokeless tobacco. She reports that she does not drink alcohol or use drugs.  Allergies:  Allergies  Allergen Reactions  . Codeine     Medications Prior to Admission  Medication Sig Dispense Refill  . ACETAMINOPHEN-BUTALBITAL (BUPAP) 50-650 MG TABS Take 50-650 mg by mouth every 4 (four) hours.      . amphetamine-dextroamphetamine (ADDERALL XR, 30MG,) 30 MG 24 hr capsule Take 30 mg by mouth every morning.      . aspirin 81 MG tablet Take 81 mg by mouth daily.      . b complex vitamins capsule Take 1 capsule by mouth daily.    . Biotin (BIOTIN 5000) 5 MG CAPS Take by mouth.    . buPROPion (WELLBUTRIN  XL) 300 MG 24 hr tablet Take 300 mg by mouth.    . cholecalciferol (VITAMIN D) 1000 UNITS tablet Take 1,000 Units by mouth daily.      . cycloSPORINE (RESTASIS) 0.05 % ophthalmic emulsion Place 1 drop into both eyes 2 (two) times daily.    . fluticasone (FLONASE) 50 MCG/ACT nasal spray 1-2 sprays in each nostril 1-2 times daily    . folic acid (FOLVITE) 400 MCG tablet Take 400 mcg by mouth daily.      . Ginkgo Biloba 120 MG CAPS Take 120 mg by mouth.      . hydrochlorothiazide 25 MG tablet Take 25 mg by mouth daily.      . Melatonin 5 MG CAPS Take 8 mg by mouth at bedtime.     . Multiple Vitamin (MULTIVITAMIN) capsule Take 1 capsule by mouth daily.      . thyroid (ARMOUR THYROID) 15 MG tablet Take 1 tablet by mouth daily.    . thyroid (ARMOUR) 30 MG tablet Take 30 mg by mouth daily before breakfast.      No results found for this or any previous visit (from the past 48 hour(s)). Ct Abdomen Pelvis W Contrast  Result Date: 09/24/2017 CLINICAL DATA:  Left   lower quadrant abdominal pain. EXAM: CT ABDOMEN AND PELVIS WITH CONTRAST TECHNIQUE: Multidetector CT imaging of the abdomen and pelvis was performed using the standard protocol following bolus administration of intravenous contrast. CONTRAST:  100mL ISOVUE-300 IOPAMIDOL (ISOVUE-300) INJECTION 61% COMPARISON:  None. FINDINGS: Lower chest: No acute abnormality. Hepatobiliary: No focal liver abnormality is seen. Status post cholecystectomy. No biliary dilatation. Pancreas: Unremarkable. No pancreatic ductal dilatation or surrounding inflammatory changes. Spleen: Normal in size without focal abnormality. Adrenals/Urinary Tract: Adrenal glands are unremarkable. Kidneys are normal, without renal calculi, focal lesion, or hydronephrosis. Bladder is unremarkable. Stomach/Bowel: Stomach is within normal limits. Appendix appears normal. No evidence of bowel wall thickening, distention, or inflammatory changes. Vascular/Lymphatic: No significant vascular findings  are present. No enlarged abdominal or pelvic lymph nodes. Reproductive: Uterus and bilateral adnexa are unremarkable. Other: No abdominal wall hernia or abnormality. No abdominopelvic ascites. Musculoskeletal: No acute or significant osseous findings. IMPRESSION: No significant abnormality seen in the abdomen or pelvis. Electronically Signed   By: James  Green Jr, M.D.   On: 09/24/2017 08:34    ROS no recent fever, chills, nausea, vomiting or changes in her appetite.  Blood pressure 127/76, pulse 65, temperature 97.6 F (36.4 C), temperature source Oral, resp. rate 14, height 5' 2" (1.575 m), weight 53 kg (116 lb 12.8 oz), last menstrual period 09/10/2003. Physical Exam  Well-nourished well-developed woman in no apparent distress.  Alert and oriented x4.  Mood and affect are normal.  Extraocular motions are intact.  Respirations are unlabored.  Her gait is normal.  The right forefoot has second third and fourth hammertoe deformities with callus beneath the metatarsal heads.  No lymphadenopathy.  Pulses are palpable.  Sensibility to light touch is intact at the forefoot dorsally and plantarly.  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  The risks and benefits of the alternative treatment options have been discussed in detail.  The patient wishes to proceed with surgery and specifically understands risks of bleeding, infection, nerve damage, blood clots, need for additional surgery, amputation and death.   HEWITT, JOHN, MD 09/25/2017, 8:35 AM   

## 2017-09-25 NOTE — Op Note (Signed)
09/25/2017  9:58 AM  PATIENT:  Catherine Peterson  67 y.o. female  PRE-OPERATIVE DIAGNOSIS:  Right 2-4 hammertoes and metatarsalgia  POST-OPERATIVE DIAGNOSIS:  Right 2-4 hammertoes and metatarsalgia  Procedure(s):  Right 2-4 metatarsal Weil osteotomies and hammertoe corrections through separate incisions  SURGEON:  Wylene Simmer, MD  ASSISTANT: Mechele Claude, PA-C  ANESTHESIA:   General, regional  EBL:  minimal   TOURNIQUET:  approx 30 min at 563 mm Hg  COMPLICATIONS:  None apparent  DISPOSITION:  Extubated, awake and stable to recovery.  INDICATION FOR PROCEDURE: The patient is a 67 year old female without significant past medical history.  She has chronic right forefoot pain due to second third and fourth hammertoes with metatarsalgia.  She has failed nonoperative treatment to date including activity modification, oral anti-inflammatories and shoe wear modification.  She presents now for operative treatment of these painful conditions.  She understands the risks and benefits of the alternative treatment options and elects surgical treatment.  She specifically understands risks of bleeding, infection, nerve damage, blood clots, recurrence of her deformities, nonunion, continued pain, amputation and death.  PROCEDURE IN DETAIL: After preoperative consent was obtained and the correct operative site was identified, the patient was brought to the operating room and placed supine on the operating table.  General anesthesia was induced.  Preoperative antibiotics were administered.  Surgical timeout was taken.  The right lower extremity was prepped and draped in standard sterile fashion with a tourniquet around the thigh.  The extremity was exsanguinated and the tourniquet was inflated to 250 mmHg.  A longitudinal incision was made over the dorsum of the second MTP joint.  Dissection was carried down through the skin and subcutaneous tissues.  The extensor tendons were noted to be quite tight.   These were lengthened the.  The dorsal joint capsule was excised exposing the metatarsal head.  A Weil osteotomy was made with the oscillating saw removing a small wedge of bone distally.  The head was allowed to retract proximally approximately 2 mm.  This osteotomy was then fixed with a 2 mm Biomet FRS screw.  Overhanging bone was trimmed with a rongeur.  The same procedure was then performed for the third and fourth MTP joints through separate incisions.  The extensor tendons were lengthened at the third MTP joint but not at the fourth.  Attention was turned to the second toe where a transverse incision was made across the PIP joint.  Dissection was carried down through the extensor mechanism.  The head of the proximal phalanx was resected with the oscillating saw followed by the base of the middle phalanx.  The joint was reduced and fixed with a Biomet VPC screw.  2.5 mm x 20 mm.  The same procedure was then performed for the third and fourth toes again fixing the joints with 2.5 mm VPC screws.  AP and lateral radiographs confirmed appropriate reduction of the osteotomies and PIP joints.  Hardware is appropriately positioned and of the appropriate lengths.    The wounds were then irrigated copiously.  The extensor tendons were repaired with 3-0 Monocryl.  Some cutaneous tissues were approximated with Monocryl.  Skin incisions were closed with 3-0 nylon.  Sterile dressings were applied followed by a compression wrap.  Tourniquet was released after application of the dressings.  Patient was awakened from anesthesia and transported to the recovery room in stable condition.  FOLLOW UP PLAN: Weightbearing as tolerated on the right foot in a Darco shoe.  Follow-up with me  in the office in 2 weeks for suture removal and to initiate plantar flexion stretching of the toes.  RADIOGRAPHS: AP and lateral radiographs of the right forefoot are obtained intraoperatively.  These show interval correction of second  third and fourth hammertoe deformities.  Interval Weil osteotomies of the second, third and fourth metatarsals were identified.  Hardware is appropriately positioned and of the appropriate lengths.    Mechele Claude PA-C was present and scrubbed for the duration of the operative case. His assistance assistance was essential in positioning the patient, prepping and draping, gaining maintaining exposure, performing the operation, closing and dressing the wounds and applying the splint.

## 2017-09-25 NOTE — Anesthesia Preprocedure Evaluation (Addendum)
Anesthesia Evaluation  Patient identified by MRN, date of birth, ID band Patient awake    Reviewed: Allergy & Precautions, NPO status , Patient's Chart, lab work & pertinent test results  Airway Mallampati: I       Dental no notable dental hx. (+) Teeth Intact   Pulmonary neg pulmonary ROS,    Pulmonary exam normal breath sounds clear to auscultation       Cardiovascular negative cardio ROS Normal cardiovascular exam Rhythm:Regular Rate:Normal     Neuro/Psych PSYCHIATRIC DISORDERS Anxiety Depression negative neurological ROS     GI/Hepatic Neg liver ROS,   Endo/Other  negative endocrine ROS  Renal/GU   negative genitourinary   Musculoskeletal   Abdominal Normal abdominal exam  (+)   Peds  Hematology negative hematology ROS (+)   Anesthesia Other Findings   Reproductive/Obstetrics                            Anesthesia Physical Anesthesia Plan  ASA: II  Anesthesia Plan: General   Post-op Pain Management:  Regional for Post-op pain   Induction: Intravenous  PONV Risk Score and Plan:   Airway Management Planned: LMA  Additional Equipment:   Intra-op Plan:   Post-operative Plan: Extubation in OR  Informed Consent: I have reviewed the patients History and Physical, chart, labs and discussed the procedure including the risks, benefits and alternatives for the proposed anesthesia with the patient or authorized representative who has indicated his/her understanding and acceptance.   Dental advisory given  Plan Discussed with: CRNA  Anesthesia Plan Comments:        Anesthesia Quick Evaluation

## 2017-09-25 NOTE — Discharge Instructions (Addendum)
Post Anesthesia Home Care Instructions  Activity: Get plenty of rest for the remainder of the day. A responsible individual must stay with you for 24 hours following the procedure.  For the next 24 hours, DO NOT: -Drive a car -Paediatric nurse -Drink alcoholic beverages -Take any medication unless instructed by your physician -Make any legal decisions or sign important papers.  Meals: Start with liquid foods such as gelatin or soup. Progress to regular foods as tolerated. Avoid greasy, spicy, heavy foods. If nausea and/or vomiting occur, drink only clear liquids until the nausea and/or vomiting subsides. Call your physician if vomiting continues.  Special Instructions/Symptoms: Your throat may feel dry or sore from the anesthesia or the breathing tube placed in your throat during surgery. If this causes discomfort, gargle with warm salt water. The discomfort should disappear within 24 hours.  If you had a scopolamine patch placed behind your ear for the management of post- operative nausea and/or vomiting:  1. The medication in the patch is effective for 72 hours, after which it should be removed.  Wrap patch in a tissue and discard in the trash. Wash hands thoroughly with soap and water. 2. You may remove the patch earlier than 72 hours if you experience unpleasant side effects which may include dry mouth, dizziness or visual disturbances. 3. Avoid touching the patch. Wash your hands with soap and water after contact with the patch.     Regional Anesthesia Blocks  1. Numbness or the inability to move the "blocked" extremity may last from 3-48 hours after placement. The length of time depends on the medication injected and your individual response to the medication. If the numbness is not going away after 48 hours, call your surgeon.  2. The extremity that is blocked will need to be protected until the numbness is gone and the  Strength has returned. Because you cannot feel it, you  will need to take extra care to avoid injury. Because it may be weak, you may have difficulty moving it or using it. You may not know what position it is in without looking at it while the block is in effect.  3. For blocks in the legs and feet, returning to weight bearing and walking needs to be done carefully. You will need to wait until the numbness is entirely gone and the strength has returned. You should be able to move your leg and foot normally before you try and bear weight or walk. You will need someone to be with you when you first try to ensure you do not fall and possibly risk injury.  4. Bruising and tenderness at the needle site are common side effects and will resolve in a few days.  5. Persistent numbness or new problems with movement should be communicated to the surgeon or the [facility] [phone] [place] [phone]).  Wylene Simmer, MD [place]  Please read the following information regarding your care after surgery.  Medications  You only need a prescription for the narcotic pain medicine (ex. oxycodone, Percocet, Norco).  All of the other medicines listed below are available over the counter. X Aleve 2 pills twice a day for the first 3 days after surgery. X acetominophen (Tylenol) 650 mg every 4-6 hours as you need for minor to moderate pain X oxycodone as prescribed for severe pain  Narcotic pain medicine (ex. oxycodone, Percocet, Vicodin) will cause constipation.  To prevent this problem, take the following medicines while you are taking  any pain medicine. X docusate sodium (Colace) 100 mg twice a day X senna (Senokot) 2 tablets twice a day  Weight Bearing X Bear weight when you are able on your operated leg or foot in the flat post-op shoe.  Cast / Splint / Dressing X Keep your splint, cast or dressing clean and dry.  Dont put anything (coat hanger, pencil, etc) down inside of it.  If it gets damp, use a hair  dryer on the cool setting to dry it.  If it gets soaked, call the office to schedule an appointment for a cast change.   After your dressing, cast or splint is removed; you may shower, but do not soak or scrub the wound.  Allow the water to run over it, and then gently pat it dry.  Swelling It is normal for you to have swelling where you had surgery.  To reduce swelling and pain, keep your toes above your nose for at least 3 days after surgery.  It may be necessary to keep your foot or leg elevated for several weeks.  If it hurts, it should be elevated.  Follow Up Call my office at [phone] when you are discharged from the hospital or surgery center to schedule an appointment to be seen two weeks after surgery.  Call my office at [phone] if you develop a fever >101.5 F, nausea, vomiting, bleeding from the surgical site or severe pain.

## 2017-09-25 NOTE — Transfer of Care (Signed)
Immediate Anesthesia Transfer of Care Note  Patient: SHAQUERA ANSLEY  Procedure(s) Performed: Right 2-4 metatarsal Weil osteotomies and hammertoe corrections (Right Foot)  Patient Location: PACU  Anesthesia Type:GA combined with regional for post-op pain  Level of Consciousness: awake and patient cooperative  Airway & Oxygen Therapy: Patient Spontanous Breathing and Patient connected to face mask oxygen  Post-op Assessment: Report given to RN and Post -op Vital signs reviewed and stable  Post vital signs: Reviewed and stable  Last Vitals:  Vitals:   09/25/17 0835 09/25/17 0840  BP:    Pulse: 61 60  Resp: 13 (!) 7  Temp:    SpO2: 100% 100%    Last Pain:  Vitals:   09/25/17 0811  TempSrc: Oral         Complications: No apparent anesthesia complications

## 2017-09-25 NOTE — Anesthesia Procedure Notes (Signed)
Procedure Name: LMA Insertion Date/Time: 09/25/2017 9:10 AM Performed by: Marrianne Mood, CRNA Pre-anesthesia Checklist: Patient identified, Emergency Drugs available, Suction available, Patient being monitored and Timeout performed Patient Re-evaluated:Patient Re-evaluated prior to induction Oxygen Delivery Method: Circle system utilized Preoxygenation: Pre-oxygenation with 100% oxygen Induction Type: IV induction Ventilation: Mask ventilation without difficulty LMA: LMA inserted LMA Size: 4.0 Number of attempts: 1 Airway Equipment and Method: Bite block Placement Confirmation: positive ETCO2 Tube secured with: Tape Dental Injury: Teeth and Oropharynx as per pre-operative assessment

## 2017-09-25 NOTE — Anesthesia Postprocedure Evaluation (Signed)
Anesthesia Post Note  Patient: SARETTA DAHLEM  Procedure(s) Performed: Right 2-4 metatarsal Weil osteotomies and hammertoe corrections (Right Foot)     Patient location during evaluation: PACU Anesthesia Type: General Level of consciousness: awake Pain management: pain level controlled Vital Signs Assessment: post-procedure vital signs reviewed and stable Respiratory status: spontaneous breathing Cardiovascular status: stable Postop Assessment: no apparent nausea or vomiting Anesthetic complications: no    Last Vitals:  Vitals:   09/25/17 1030 09/25/17 1045  BP: (!) 112/48 (!) 112/58  Pulse: 74 70  Resp: (!) 9 12  Temp:    SpO2: 100% 100%    Last Pain:  Vitals:   09/25/17 1045  TempSrc:   PainSc: 0-No pain   Pain Goal:        RLE Motor Response: Purposeful movement (09/25/17 1045) RLE Sensation: Decreased (09/25/17 1045)      [place]

## 2017-09-25 NOTE — Progress Notes (Signed)
Assisted Dr. Hatchett with right, ultrasound guided, popliteal block. Side rails up, monitors on throughout procedure. See vital signs in flow sheet. Tolerated Procedure well.  

## 2017-09-25 NOTE — Anesthesia Procedure Notes (Addendum)
Anesthesia Regional Block: Popliteal block   Pre-Anesthetic Checklist: ,, timeout performed, Correct Patient, Correct Site, Correct Laterality, Correct Procedure, Correct Position, site marked, Risks and benefits discussed,  Surgical consent,  Pre-op evaluation,  At surgeon's request and post-op pain management  Laterality: Right and Lower  Prep: chloraprep       Needles:  Injection technique: Single-shot  Needle Type: Echogenic Stimulator Needle     Needle Length: 9cm  Needle Gauge: 21   Needle insertion depth: 2 cm   Additional Needles:   Procedures:,,,, ultrasound used (permanent image in chart),,,,  Narrative:  Start time: 09/25/2017 8:24 AM End time: 09/25/2017 8:34 AM Injection made incrementally with aspirations every 5 mL.  Performed by: Personally  Anesthesiologist: Lyn Hollingshead, MD

## 2017-09-26 ENCOUNTER — Encounter (HOSPITAL_BASED_OUTPATIENT_CLINIC_OR_DEPARTMENT_OTHER): Payer: Self-pay | Admitting: Orthopedic Surgery

## 2018-01-14 ENCOUNTER — Ambulatory Visit: Payer: Medicare Other | Admitting: Nurse Practitioner

## 2018-02-09 NOTE — Progress Notes (Signed)
67 y.o. [id number] WidowedCaucasianF here for annual exam.  She c/o vaginal dryness, notices it when she wipes, cleans. Not sexually active. Not using the premarin.  No vaginal bleeding. No bowel or bladder c/o.  She c/o bulging in her left lower abdomen, negative CT.     Patient's last menstrual period was 09/10/2003.          Sexually active: No.  The current method of family planning is post menopausal status.    Exercising: Yes.    walking, yard work and balance ball Smoker:  no  Health Maintenance: Pap: 01-13-17 Neg, 12-16-13 Neg:Neg HR HPV History of abnormal Pap:  no MMG: 11-13-16 Density C,poss.distortion Rt.Br. Lt.Br.Neg. Rt.Br.Diag.distortion resolves/Neg/BiRads2 Colonoscopy:  10/19/2014 Polyps. Repeat in 5 years BMD:  06-06-17 Osteopenia, FRAX 8.4%/1.0% TDaP:  2018 Gardasil: n/a   reports that she has never smoked. She has never used smokeless tobacco. She reports that she does not drink alcohol or use drugs. Retired. Both sons are in [place]. She has a huge network here. 4 grand kids.   Past Medical History:  Diagnosis Date  . ADHD (attention deficit hyperactivity disorder)   . Basal cell carcinoma    Leg and back of R arm   . GERD (gastroesophageal reflux disease)   . Hyperlipidemia   . Hypokalemia   . PMB (postmenopausal bleeding) 01/2002   endo polyp  . Thyroid disease 2002   hypothyroidism    Past Surgical History:  Procedure Laterality Date  . CHOLECYSTECTOMY, LAPAROSCOPIC  06/07/11  . HAMMERTOE RECONSTRUCTION WITH WEIL OSTEOTOMY Right 09/25/2017   Procedure: Right 2-4 metatarsal Weil osteotomies and hammertoe corrections;  Surgeon: Wylene Simmer, MD;  Location: [facility];  Service: Orthopedics;  Laterality: Right;  . HYSTEROSCOPY  01/2002   endo polyp and focal simple hyperplasia  . POPLITEAL SYNOVIAL CYST EXCISION Left 2008 and 2010  . TONSILLECTOMY      Current Outpatient Medications  Medication Sig Dispense Refill  . ACETAMINOPHEN-BUTALBITAL (BUPAP)  50-650 MG TABS Take 50-650 mg by mouth every 4 (four) hours.      Marland Kitchen amphetamine-dextroamphetamine (ADDERALL XR, '30MG'$ ,) 30 MG 24 hr capsule Take 30 mg by mouth every morning.      Marland Kitchen aspirin 81 MG tablet Take 81 mg by mouth daily.      Marland Kitchen b complex vitamins capsule Take 1 capsule by mouth daily.    . Biotin (BIOTIN 5000) 5 MG CAPS Take by mouth.    Marland Kitchen buPROPion (WELLBUTRIN XL) 300 MG 24 hr tablet Take 300 mg by mouth.    . cholecalciferol (VITAMIN D) 1000 UNITS tablet Take 1,000 Units by mouth daily.      . cycloSPORINE (RESTASIS) 0.05 % ophthalmic emulsion Place 1 drop into both eyes 2 (two) times daily.    . fluticasone (FLONASE) 50 MCG/ACT nasal spray 1-2 sprays in each nostril 1-2 times daily    . folic acid (FOLVITE) 948 MCG tablet Take 400 mcg by mouth daily.      . Ginkgo Biloba 120 MG CAPS Take 120 mg by mouth.      . hydrochlorothiazide 25 MG tablet Take 25 mg by mouth daily.      . Melatonin 5 MG CAPS Take 8 mg by mouth at bedtime.     . Multiple Vitamin (MULTIVITAMIN) capsule Take 1 capsule by mouth daily.      Marland Kitchen thyroid (ARMOUR THYROID) 15 MG tablet Take 1 tablet by mouth daily.    Marland Kitchen thyroid (ARMOUR) 30 MG tablet Take  30 mg by mouth daily before breakfast.     No current facility-administered medications for this visit.   On HCTZ for swelling  Family History  Problem Relation Age of Onset  . Cancer Mother        multiple myeloma  . Cancer Father        oral  . Diabetes Father   . Diabetes Brother   . Diabetes Brother   . Heart disease Brother 26       secondary to drug use  . Diabetes Paternal Grandmother   . Osteoporosis Unknown     Review of Systems  Constitutional: Negative.   HENT: Negative.   Eyes: Negative.   Respiratory: Negative.   Cardiovascular: Positive for leg swelling.  Gastrointestinal: Positive for nausea.       Bloating--patient knows this is relating to diet  Endocrine: Positive for cold intolerance and heat intolerance.  Genitourinary: Negative.    Musculoskeletal:       Muscle and joint aches --LLQ  Skin: Negative.   Allergic/Immunologic: Negative.   Neurological: Negative.   Hematological: Negative.   Psychiatric/Behavioral: Negative.   Nausea occurs with acidic foods.  Exam:   BP 100/68 (BP Location: Right Arm, Patient Position: Sitting, Cuff Size: Normal)   Pulse 70   Resp 12   Ht _0  (1.575 m)   Wt 119 lb (54 kg)   LMP 09/10/2003   BMI 21.77 kg/m   Weight change: _1 @ Height:   Height: _2  (157.5 cm)  Ht Readings from Last 3 Encounters:  02/12/18 _3  (1.575 m)  09/25/17 _4  (1.575 m)  01/13/17 _5  (1.575 m)    General appearance: alert, cooperative and appears stated age Head: Normocephalic, without obvious abnormality, atraumatic Neck: no adenopathy, supple, symmetrical, trachea midline and thyroid normal to inspection and palpation Lungs: clear to auscultation bilaterally Cardiovascular: regular rate and rhythm Breasts: normal appearance, no masses or tenderness Abdomen: soft, non-tender; non distended,  no masses,  no organomegaly, no hernia. With standing her LLQ does bulge slightly more than the RLQ. When lying down and tensing her abdomen, slightly less tense in the LLQ. Extremities: extremities normal, atraumatic, no cyanosis or edema Skin: Skin color, texture, turgor normal. No rashes or lesions Lymph nodes: Cervical, supraclavicular, and axillary nodes normal. No abnormal inguinal nodes palpated Neurologic: Grossly normal   Pelvic: External genitalia:  no lesions              Urethra:  normal appearing urethra with no masses, tenderness or lesions              Bartholins and Skenes: normal                 Vagina: atrophic appearing vagina with normal color and discharge, no lesions              Cervix: no lesions               Bimanual Exam:  Uterus:  normal size, contour, position, consistency, mobility, non-tender              Adnexa: no mass, fullness, tenderness                Rectovaginal: Confirms               Anus:  normal sphincter tone, no lesions  Chaperone was present for exam.  A:  Well Woman with normal exam  Osteopenia  Vaginal atrophy, not bothersome, not sexually  active. She stopped using the vaginal estrogen.  P:   No pap this year  Discussed breast self exam  Discussed calcium and vit D intake  Mammogram  Colonoscopy UTD  DEXA in 9/20

## 2018-02-12 ENCOUNTER — Ambulatory Visit (INDEPENDENT_AMBULATORY_CARE_PROVIDER_SITE_OTHER): Payer: Medicare Other | Admitting: Obstetrics and Gynecology

## 2018-02-12 ENCOUNTER — Encounter: Payer: Self-pay | Admitting: Obstetrics and Gynecology

## 2018-02-12 ENCOUNTER — Other Ambulatory Visit: Payer: Self-pay

## 2018-02-12 VITALS — BP 100/68 | HR 70 | Resp 12 | Ht 62.0 in | Wt 119.0 lb

## 2018-02-12 DIAGNOSIS — N952 Postmenopausal atrophic vaginitis: Secondary | ICD-10-CM

## 2018-02-12 DIAGNOSIS — Z01419 Encounter for gynecological examination (general) (routine) without abnormal findings: Secondary | ICD-10-CM | POA: Diagnosis not present

## 2018-02-12 DIAGNOSIS — M8580 Other specified disorders of bone density and structure, unspecified site: Secondary | ICD-10-CM | POA: Diagnosis not present

## 2018-02-12 NOTE — Patient Instructions (Signed)

## 2018-03-24 DIAGNOSIS — M7122 Synovial cyst of popliteal space [Baker], left knee: Secondary | ICD-10-CM | POA: Insufficient documentation

## 2018-06-23 ENCOUNTER — Other Ambulatory Visit: Payer: Self-pay | Admitting: Family Medicine

## 2018-06-23 DIAGNOSIS — F325 Major depressive disorder, single episode, in full remission: Secondary | ICD-10-CM | POA: Insufficient documentation

## 2018-06-23 DIAGNOSIS — F988 Other specified behavioral and emotional disorders with onset usually occurring in childhood and adolescence: Secondary | ICD-10-CM | POA: Insufficient documentation

## 2018-06-23 DIAGNOSIS — Z1231 Encounter for screening mammogram for malignant neoplasm of breast: Secondary | ICD-10-CM

## 2018-06-23 DIAGNOSIS — R601 Generalized edema: Secondary | ICD-10-CM | POA: Insufficient documentation

## 2018-06-23 DIAGNOSIS — G44209 Tension-type headache, unspecified, not intractable: Secondary | ICD-10-CM | POA: Insufficient documentation

## 2018-06-23 DIAGNOSIS — E039 Hypothyroidism, unspecified: Secondary | ICD-10-CM | POA: Insufficient documentation

## 2018-08-13 ENCOUNTER — Ambulatory Visit: Payer: Medicare Other

## 2018-08-13 ENCOUNTER — Ambulatory Visit
Admission: RE | Admit: 2018-08-13 | Discharge: 2018-08-13 | Disposition: A | Discharge status: Home / Self Care | Payer: Medicare Other | Source: Ambulatory Visit | Attending: Family Medicine | Admitting: Family Medicine

## 2018-08-13 DIAGNOSIS — Z1231 Encounter for screening mammogram for malignant neoplasm of breast: Secondary | ICD-10-CM

## 2019-02-23 ENCOUNTER — Other Ambulatory Visit: Payer: Self-pay

## 2019-02-24 NOTE — Progress Notes (Signed)
68 y.o. G62P2002 Widowed White or Caucasian Not Hispanic or Latino female here for annual exam.   She is having pain in her thumbs. She will discuss with her primary.  No vaginal bleeding. She is using a vaginal moisturizer, which helps.     Patient's last menstrual period was 09/10/2003.          Sexually active: No.  The current method of family planning is post menopausal status.    Exercising: Yes.    walking, household projects Smoker:  no  Health Maintenance: Pap: 01-13-17 Neg, 12-16-13 Neg:Neg HR HPV History of abnormal Pap:  no MMG: 08/13/2018 Birads 1 negative Colonoscopy: 10/19/2014 Polyps. Repeat in 5 years BMD:  06-06-17 Osteopenia, FRAX 8.4%/1.0% TDaP:  2018 Gardasil: n/a   reports that she has never smoked. She has never used smokeless tobacco. She reports that she does not drink alcohol or use drugs. Retired. Both sons are in [place]. She has a huge network here. 4 grand kids.   Past Medical History:  Diagnosis Date  . ADHD (attention deficit hyperactivity disorder)   . Basal cell carcinoma    Leg and back of R arm   . GERD (gastroesophageal reflux disease)   . Hyperlipidemia   . Hypokalemia   . PMB (postmenopausal bleeding) 01/2002   endo polyp  . Squamous cell skin cancer   . Thyroid disease 2002   hypothyroidism    Past Surgical History:  Procedure Laterality Date  . CHOLECYSTECTOMY, LAPAROSCOPIC  06/07/11  . HAMMERTOE RECONSTRUCTION WITH WEIL OSTEOTOMY Right 09/25/2017   Procedure: Right 2-4 metatarsal Weil osteotomies and hammertoe corrections;  Surgeon: Wylene Simmer, MD;  Location: [facility];  Service: Orthopedics;  Laterality: Right;  . HYSTEROSCOPY  01/2002   endo polyp and focal simple hyperplasia  . POPLITEAL SYNOVIAL CYST EXCISION Left 2008 and 2010  . TONSILLECTOMY      Current Outpatient Medications  Medication Sig Dispense Refill  . ACETAMINOPHEN-BUTALBITAL (BUPAP) 50-650 MG TABS Take 50-650 mg by mouth as needed.     Marland Kitchen  amphetamine-dextroamphetamine (ADDERALL XR, 30MG,) 30 MG 24 hr capsule Take 30 mg by mouth every morning.      Marland Kitchen aspirin 81 MG tablet Take 81 mg by mouth daily.      Marland Kitchen b complex vitamins capsule Take 1 capsule by mouth daily.    . Biotin (BIOTIN 5000) 5 MG CAPS Take by mouth.    Marland Kitchen buPROPion (WELLBUTRIN XL) 300 MG 24 hr tablet Take 300 mg by mouth.    . cholecalciferol (VITAMIN D) 1000 UNITS tablet Take 1,000 Units by mouth daily.      . fluticasone (FLONASE) 50 MCG/ACT nasal spray as needed.     . folic acid (FOLVITE) 410 MCG tablet Take 400 mcg by mouth daily.      . Ginkgo Biloba 120 MG CAPS Take 120 mg by mouth.      . hydrochlorothiazide 25 MG tablet Take 25 mg by mouth daily.      . Melatonin 5 MG CAPS Take 8 mg by mouth at bedtime.     . Multiple Vitamin (MULTIVITAMIN) capsule Take 1 capsule by mouth daily.      Marland Kitchen thyroid (ARMOUR THYROID) 15 MG tablet Take 1 tablet by mouth daily.    Marland Kitchen thyroid (ARMOUR) 30 MG tablet Take 30 mg by mouth daily before breakfast.     No current facility-administered medications for this visit.     Family History  Problem Relation Age of Onset  .  Cancer Mother        multiple myeloma  . Cancer Father        oral  . Diabetes Father   . Diabetes Brother   . Diabetes Brother   . Heart disease Brother 87       secondary to drug use  . Diabetes Paternal Grandmother   . Osteoporosis Other     Review of Systems  Constitutional:       Thumb pain  HENT: Negative.   Eyes: Negative.   Respiratory: Negative.   Cardiovascular: Negative.   Gastrointestinal: Negative.   Endocrine: Negative.   Genitourinary: Negative.   Musculoskeletal: Negative.   Skin: Negative.   Allergic/Immunologic: Negative.   Neurological: Negative.   Hematological: Negative.   Psychiatric/Behavioral: Negative.     Exam:   BP 110/68 (BP Location: Right Arm, Patient Position: Sitting, Cuff Size: Normal)   Pulse 68   Temp 97.8 F (36.6 C) (Skin)   Ht _0  (1.575 m)   Wt  118 lb 6.4 oz (53.7 kg)   LMP 09/10/2003   BMI 21.66 kg/m   Weight change: _1 @ Height:   Height: _2  (157.5 cm)  Ht Readings from Last 3 Encounters:  02/25/19 _3  (1.575 m)  02/12/18 _4  (1.575 m)  09/25/17 _5  (1.575 m)    General appearance: alert, cooperative and appears stated age Head: Normocephalic, without obvious abnormality, atraumatic Neck: no adenopathy, supple, symmetrical, trachea midline and thyroid normal to inspection and palpation Lungs: clear to auscultation bilaterally Cardiovascular: regular rate and rhythm Breasts: normal appearance, no masses or tenderness Abdomen: soft, non-tender; non distended,  no masses,  no organomegaly Extremities: extremities normal, atraumatic, no cyanosis or edema Skin: Skin color, texture, turgor normal. No rashes or lesions Lymph nodes: Cervical, supraclavicular, and axillary nodes normal. No abnormal inguinal nodes palpated Neurologic: Grossly normal   Pelvic: External genitalia:  no lesions              Urethra:  normal appearing urethra with no masses, tenderness or lesions              Bartholins and Skenes: normal                 Vagina: atrophic appearing vagina with normal color and discharge, no lesions              Cervix: no lesions               Bimanual Exam:  Uterus:  normal size, contour, position, consistency, mobility, non-tender              Adnexa: no mass, fullness, tenderness               Rectovaginal: Confirms               Anus:  normal sphincter tone, no lesions  Chaperone was present for exam.  A:  Well Woman with normal exam  P:   No pap this year  Mammogram in 12/20  Colonoscopy next year  DEXA with her primary  Discussed breast self exam  Discussed calcium and vit D intake

## 2019-02-25 ENCOUNTER — Encounter: Payer: Self-pay | Admitting: Obstetrics and Gynecology

## 2019-02-25 ENCOUNTER — Ambulatory Visit (INDEPENDENT_AMBULATORY_CARE_PROVIDER_SITE_OTHER): Payer: Medicare Other | Admitting: Obstetrics and Gynecology

## 2019-02-25 ENCOUNTER — Other Ambulatory Visit: Payer: Self-pay

## 2019-02-25 VITALS — BP 110/68 | HR 68 | Temp 97.8°F | Ht 62.0 in | Wt 118.4 lb

## 2019-02-25 DIAGNOSIS — Z01419 Encounter for gynecological examination (general) (routine) without abnormal findings: Secondary | ICD-10-CM

## 2019-02-25 NOTE — Patient Instructions (Signed)
EXERCISE AND DIET:  We recommended that you start or continue a regular exercise program for good health. Regular exercise means any activity that makes your heart beat faster and makes you sweat.  We recommend exercising at least 30 minutes per day at least 3 days a week, preferably 4 or 5.  We also recommend a diet low in fat and sugar.  Inactivity, poor dietary choices and obesity can cause diabetes, heart attack, stroke, and kidney damage, among others.    ALCOHOL AND SMOKING:  Women should limit their alcohol intake to no more than 7 drinks/beers/glasses of wine (combined, not each!) per week. Moderation of alcohol intake to this level decreases your risk of breast cancer and liver damage. And of course, no recreational drugs are part of a healthy lifestyle.  And absolutely no smoking or even second hand smoke. Most people know smoking can cause heart and lung diseases, but did you know it also contributes to weakening of your bones? Aging of your skin?  Yellowing of your teeth and nails?  CALCIUM AND VITAMIN D:  Adequate intake of calcium and Vitamin D are recommended.  The recommendations for exact amounts of these supplements seem to change often, but generally speaking 1,000 mg of calcium (between diet and supplement) and 800 units of Vitamin D per day seems prudent. Certain women may benefit from higher intake of Vitamin D.  If you are among these women, your doctor will have told you during your visit.    PAP SMEARS:  Pap smears, to check for cervical cancer or precancers,  have traditionally been done yearly, although recent scientific advances have shown that most women can have pap smears less often.  However, every woman still should have a physical exam from her gynecologist every year. It will include a breast check, inspection of the vulva and vagina to check for abnormal growths or skin changes, a visual exam of the cervix, and then an exam to evaluate the size and shape of the uterus and  ovaries.  And after 69 years of age, a rectal exam is indicated to check for rectal cancers. We will also provide age appropriate advice regarding health maintenance, like when you should have certain vaccines, screening for sexually transmitted diseases, bone density testing, colonoscopy, mammograms, etc.   MAMMOGRAMS:  All women over 40 years old should have a yearly mammogram. Many facilities now offer a "3D" mammogram, which may cost around $50 extra out of pocket. If possible,  we recommend you accept the option to have the 3D mammogram performed.  It both reduces the number of women who will be called back for extra views which then turn out to be normal, and it is better than the routine mammogram at detecting truly abnormal areas.    COLON CANCER SCREENING: Now recommend starting at age 45. At this time colonoscopy is not covered for routine screening until 50. There are take home tests that can be done between 45-49.   COLONOSCOPY:  Colonoscopy to screen for colon cancer is recommended for all women at age 50.  We know, you hate the idea of the prep.  We agree, BUT, having colon cancer and not knowing it is worse!!  Colon cancer so often starts as a polyp that can be seen and removed at colonscopy, which can quite literally save your life!  And if your first colonoscopy is normal and you have no family history of colon cancer, most women don't have to have it again for   10 years.  Once every ten years, you can do something that may end up saving your life, right?  We will be happy to help you get it scheduled when you are ready.  Be sure to check your insurance coverage so you understand how much it will cost.  It may be covered as a preventative service at no cost, but you should check your particular policy.      Breast Self-Awareness Breast self-awareness means being familiar with how your breasts look and feel. It involves checking your breasts regularly and reporting any changes to your  health care provider. Practicing breast self-awareness is important. A change in your breasts can be a sign of a serious medical problem. Being familiar with how your breasts look and feel allows you to find any problems early, when treatment is more likely to be successful. All women should practice breast self-awareness, including women who have had breast implants. How to do a breast self-exam One way to learn what is normal for your breasts and whether your breasts are changing is to do a breast self-exam. To do a breast self-exam: Look for Changes  1. Remove all the clothing above your waist. 2. Stand in front of a mirror in a room with good lighting. 3. Put your hands on your hips. 4. Push your hands firmly downward. 5. Compare your breasts in the mirror. Look for differences between them (asymmetry), such as: ? Differences in shape. ? Differences in size. ? Puckers, dips, and bumps in one breast and not the other. 6. Look at each breast for changes in your skin, such as: ? Redness. ? Scaly areas. 7. Look for changes in your nipples, such as: ? Discharge. ? Bleeding. ? Dimpling. ? Redness. ? A change in position. Feel for Changes Carefully feel your breasts for lumps and changes. It is best to do this while lying on your back on the floor and again while sitting or standing in the shower or tub with soapy water on your skin. Feel each breast in the following way:  Place the arm on the side of the breast you are examining above your head.  Feel your breast with the other hand.  Start in the nipple area and make  inch (2 cm) overlapping circles to feel your breast. Use the pads of your three middle fingers to do this. Apply light pressure, then medium pressure, then firm pressure. The light pressure will allow you to feel the tissue closest to the skin. The medium pressure will allow you to feel the tissue that is a little deeper. The firm pressure will allow you to feel the tissue  close to the ribs.  Continue the overlapping circles, moving downward over the breast until you feel your ribs below your breast.  Move one finger-width toward the center of the body. Continue to use the  inch (2 cm) overlapping circles to feel your breast as you move slowly up toward your collarbone.  Continue the up and down exam using all three pressures until you reach your armpit.  Write Down What You Find  Write down what is normal for each breast and any changes that you find. Keep a written record with breast changes or normal findings for each breast. By writing this information down, you do not need to depend only on memory for size, tenderness, or location. Write down where you are in your menstrual cycle, if you are still menstruating. If you are having trouble noticing differences   in your breasts, do not get discouraged. With time you will become more familiar with the variations in your breasts and more comfortable with the exam. How often should I examine my breasts? Examine your breasts every month. If you are breastfeeding, the best time to examine your breasts is after a feeding or after using a breast pump. If you menstruate, the best time to examine your breasts is 5-7 days after your period is over. During your period, your breasts are lumpier, and it may be more difficult to notice changes. When should I see my health care provider? See your health care provider if you notice:  A change in shape or size of your breasts or nipples.  A change in the skin of your breast or nipples, such as a reddened or scaly area.  Unusual discharge from your nipples.  A lump or thick area that was not there before.  Pain in your breasts.  Anything that concerns you.  

## 2019-05-05 ENCOUNTER — Other Ambulatory Visit: Payer: Self-pay | Admitting: Family Medicine

## 2019-05-05 DIAGNOSIS — M8580 Other specified disorders of bone density and structure, unspecified site: Secondary | ICD-10-CM

## 2019-07-01 ENCOUNTER — Encounter: Payer: Self-pay | Admitting: Obstetrics and Gynecology

## 2019-07-09 ENCOUNTER — Encounter: Payer: Self-pay | Admitting: Orthopedic Surgery

## 2019-07-12 ENCOUNTER — Ambulatory Visit: Payer: Self-pay

## 2019-07-12 ENCOUNTER — Encounter: Payer: Self-pay | Admitting: Orthopedic Surgery

## 2019-07-12 ENCOUNTER — Ambulatory Visit (INDEPENDENT_AMBULATORY_CARE_PROVIDER_SITE_OTHER): Payer: Medicare Other | Admitting: Orthopedic Surgery

## 2019-07-12 VITALS — Ht 62.5 in | Wt 117.0 lb

## 2019-07-12 DIAGNOSIS — M79641 Pain in right hand: Secondary | ICD-10-CM | POA: Diagnosis not present

## 2019-07-12 DIAGNOSIS — M1811 Unilateral primary osteoarthritis of first carpometacarpal joint, right hand: Secondary | ICD-10-CM

## 2019-07-15 ENCOUNTER — Encounter: Payer: Self-pay | Admitting: Orthopedic Surgery

## 2019-07-15 DIAGNOSIS — M1811 Unilateral primary osteoarthritis of first carpometacarpal joint, right hand: Secondary | ICD-10-CM

## 2019-07-15 MED ORDER — BUPIVACAINE HCL 0.25 % IJ SOLN
0.3300 mL | INTRAMUSCULAR | Status: AC | PRN
  Administered 2019-07-15: .33 mL via INTRA_ARTICULAR

## 2019-07-15 MED ORDER — LIDOCAINE HCL 1 % IJ SOLN
3.0000 mL | INTRAMUSCULAR | Status: AC | PRN
  Administered 2019-07-15: 09:00:00 3 mL

## 2019-07-15 MED ORDER — METHYLPREDNISOLONE ACETATE 40 MG/ML IJ SUSP
13.3300 mg | INTRAMUSCULAR | Status: AC | PRN
  Administered 2019-07-15: 13.33 mg via INTRA_ARTICULAR

## 2019-07-15 NOTE — Progress Notes (Signed)
Office Visit Note   Patient: Catherine Peterson           Date of Birth: [birth year]           MRN: [id number] Visit Date: 07/12/2019 Requested by: Christain Sacramento, MD [address] [place],  [place] [address] PCP: Christain Sacramento, MD  Subjective: Chief Complaint  Patient presents with  . Right Hand - Pain    HPI: Catherine Peterson is a patient with 1/2 years of right hand pain.  Started a year ago and has slowly worsened.  Reports right thumb pain radiating into the palm.  Naproxen helps but it is still there.  She is right-hand dominant.  She feels like it will click when she pinches.  She reports diminished pinch strength.  Has not had any surgery on the hand before.  Hard for her to grip things.              ROS: All systems reviewed are negative as they relate to the chief complaint within the history of present illness.  Patient denies  fevers or chills.   Assessment & Plan: Visit Diagnoses:  1. Pain in right hand     Plan: Impression is right hand pain with CMC arthritis.  Plan is ultrasound-guided injection into the [facility] joint today which she tolerated well.  I think we could repeat that once or twice a year if needed.  Follow-up with me as needed.  I think she is in the need to have surgery at some point but for now I think the injection could help her for an undetermined amount of time.  Follow-Up Instructions: No follow-ups on file.   Orders:  Orders Placed This Encounter  Procedures  . XR Hand Complete Right   No orders of the defined types were placed in this encounter.     Procedures: Small Joint Inj: R thumb CMC on 07/15/2019 8:56 AM Indications: pain Details: 25 G needle, ultrasound-guided radial approach  Spinal Needle: No  Medications: 3 mL lidocaine 1 %; 0.33 mL bupivacaine 0.25 %; 13.33 mg methylPREDNISolone acetate 40 MG/ML Outcome: tolerated well, no immediate complications Procedure, treatment alternatives, risks and benefits explained, specific risks  discussed. Consent was given by the patient. Immediately prior to procedure a time out was called to verify the correct patient, procedure, equipment, support staff and site/side marked as required. Patient was prepped and draped in the usual sterile fashion.       Clinical Data: No additional findings.  Objective: Vital Signs: Ht 5' 2.5" (1.588 m)   Wt 117 lb (53.1 kg)   LMP 09/10/2003   BMI 21.06 kg/m   Physical Exam:   Constitutional: Patient appears well-developed HEENT:  Head: Normocephalic Eyes:EOM are normal Neck: Normal range of motion Cardiovascular: Normal rate Pulmonary/chest: Effort normal Neurologic: Patient is alert Skin: Skin is warm Psychiatric: Patient has normal mood and affect    Ortho Exam: Ortho exam demonstrates diminished and painful pinch strength on the right versus left.  Negative carpal tunnel compression testing.  She has good grip strength.  Good range of motion EPL FPL interosseous strength is intact.  Elbow range of motion is full.  Positive grind test present on the right not on the left.  Specialty Comments:  No specialty comments available.  Imaging: No results found.   PMFS History: Patient Active Problem List   Diagnosis Date Noted  . Osteopenia 12/16/2013  . Postmenopausal hormone replacement therapy 12/16/2013  . Post menopausal syndrome  12/16/2013  . Chronic cholecystitis with calculus 05/21/2011   Past Medical History:  Diagnosis Date  . ADHD (attention deficit hyperactivity disorder)   . Basal cell carcinoma    Leg and back of R arm   . GERD (gastroesophageal reflux disease)   . Hyperlipidemia   . Hypokalemia   . PMB (postmenopausal bleeding) 01/2002   endo polyp  . Squamous cell skin cancer   . Thyroid disease 2002   hypothyroidism    Family History  Problem Relation Age of Onset  . Cancer Mother        multiple myeloma  . Cancer Father        oral  . Diabetes Father   . Diabetes Brother   . Diabetes  Brother   . Heart disease Brother 53       secondary to drug use  . Diabetes Paternal Grandmother   . Osteoporosis Other     Past Surgical History:  Procedure Laterality Date  . CHOLECYSTECTOMY, LAPAROSCOPIC  06/07/11  . HAMMERTOE RECONSTRUCTION WITH WEIL OSTEOTOMY Right 09/25/2017   Procedure: Right 2-4 metatarsal Weil osteotomies and hammertoe corrections;  Surgeon: Wylene Simmer, MD;  Location: [facility];  Service: Orthopedics;  Laterality: Right;  . HYSTEROSCOPY  01/2002   endo polyp and focal simple hyperplasia  . POPLITEAL SYNOVIAL CYST EXCISION Left 2008 and 2010  . TONSILLECTOMY     Social History   Occupational History  . Not on file  Tobacco Use  . Smoking status: Never Smoker  . Smokeless tobacco: Never Used  Substance and Sexual Activity  . Alcohol use: No  . Drug use: No  . Sexual activity: Not Currently    Partners: Male    Birth control/protection: Post-menopausal

## 2019-07-16 ENCOUNTER — Other Ambulatory Visit: Payer: Self-pay

## 2019-07-16 ENCOUNTER — Ambulatory Visit
Admission: RE | Admit: 2019-07-16 | Discharge: 2019-07-16 | Disposition: A | Discharge status: Home / Self Care | Payer: Medicare Other | Source: Ambulatory Visit | Attending: Family Medicine | Admitting: Family Medicine

## 2019-07-16 DIAGNOSIS — M8580 Other specified disorders of bone density and structure, unspecified site: Secondary | ICD-10-CM

## 2019-09-24 ENCOUNTER — Other Ambulatory Visit: Payer: Self-pay | Admitting: Family Medicine

## 2019-09-24 DIAGNOSIS — Z1231 Encounter for screening mammogram for malignant neoplasm of breast: Secondary | ICD-10-CM

## 2019-09-29 ENCOUNTER — Ambulatory Visit: Payer: Medicare PPO | Attending: Internal Medicine

## 2019-09-29 DIAGNOSIS — Z23 Encounter for immunization: Secondary | ICD-10-CM | POA: Insufficient documentation

## 2019-09-29 NOTE — Progress Notes (Signed)
   Covid-19 Vaccination Clinic  Name:  Catherine Peterson    MRN: [id number] DOB: 1950-11-17  09/29/2019  [name] was observed post Covid-19 immunization for 15 minutes without incidence. She was provided with Vaccine Information Sheet and instruction to access the V-Safe system.   [name] was instructed to call 911 with any severe reactions post vaccine: Marland Kitchen Difficulty breathing  . Swelling of your face and throat  . A fast heartbeat  . A bad rash all over your body  . Dizziness and weakness    Immunizations Administered    Name Date Dose VIS Date Route   Pfizer COVID-19 Vaccine 09/29/2019  5:02 PM 0.3 mL 08/20/2019 Intramuscular   Manufacturer: [place]   Lot: [id number]   [place]: [id number]

## 2019-10-20 ENCOUNTER — Ambulatory Visit: Payer: Medicare PPO | Attending: Internal Medicine

## 2019-10-20 DIAGNOSIS — Z23 Encounter for immunization: Secondary | ICD-10-CM

## 2019-10-20 NOTE — Progress Notes (Signed)
   Covid-19 Vaccination Clinic  Name:  Catherine Peterson    MRN: [id number] DOB: 1950/11/19  10/20/2019  Catherine Peterson was observed post Covid-19 immunization for 15 minutes without incidence. She was provided with Vaccine Information Sheet and instruction to access the V-Safe system.   Catherine Peterson was instructed to call 911 with any severe reactions post vaccine: Marland Kitchen Difficulty breathing  . Swelling of your face and throat  . A fast heartbeat  . A bad rash all over your body  . Dizziness and weakness    Immunizations Administered    Name Date Dose VIS Date Route   Pfizer COVID-19 Vaccine 10/20/2019 12:19 PM 0.3 mL 08/20/2019 Intramuscular   Manufacturer: [employer], [employer]   Lot: [id number]   [place]: [id number]

## 2019-11-03 ENCOUNTER — Other Ambulatory Visit: Payer: Self-pay

## 2019-11-03 ENCOUNTER — Ambulatory Visit
Admission: RE | Admit: 2019-11-03 | Discharge: 2019-11-03 | Disposition: A | Discharge status: Home / Self Care | Payer: Medicare PPO | Source: Ambulatory Visit | Attending: Family Medicine | Admitting: Family Medicine

## 2019-11-03 DIAGNOSIS — Z1231 Encounter for screening mammogram for malignant neoplasm of breast: Secondary | ICD-10-CM

## 2019-11-29 ENCOUNTER — Ambulatory Visit: Payer: Medicare PPO | Admitting: Orthopedic Surgery

## 2019-11-29 ENCOUNTER — Other Ambulatory Visit: Payer: Self-pay

## 2019-11-29 DIAGNOSIS — M1811 Unilateral primary osteoarthritis of first carpometacarpal joint, right hand: Secondary | ICD-10-CM

## 2019-12-03 ENCOUNTER — Encounter: Payer: Self-pay | Admitting: Orthopedic Surgery

## 2019-12-03 DIAGNOSIS — M1811 Unilateral primary osteoarthritis of first carpometacarpal joint, right hand: Secondary | ICD-10-CM | POA: Diagnosis not present

## 2019-12-03 MED ORDER — LIDOCAINE HCL 1 % IJ SOLN
3.0000 mL | INTRAMUSCULAR | Status: AC | PRN
  Administered 2019-12-03: 3 mL

## 2019-12-03 MED ORDER — METHYLPREDNISOLONE ACETATE 40 MG/ML IJ SUSP
13.3300 mg | INTRAMUSCULAR | Status: AC | PRN
  Administered 2019-12-03: 13.33 mg via INTRA_ARTICULAR

## 2019-12-03 MED ORDER — BUPIVACAINE HCL 0.25 % IJ SOLN
0.3300 mL | INTRAMUSCULAR | Status: AC | PRN
  Administered 2019-12-03: .33 mL via INTRA_ARTICULAR

## 2019-12-03 NOTE — Progress Notes (Signed)
Office Visit Note   Patient: Catherine Peterson           Date of Birth: [birth year]           MRN: [id number] Visit Date: 11/29/2019 Requested by: Catherine Sacramento, MD [address] [place],  [place] [address] PCP: Catherine Sacramento, MD  Subjective: Chief Complaint  Patient presents with  . Right Hand - Pain    HPI: Catherine Peterson is a 69 y.o. female who presents to the office complaining of right hand pain.  Patient is following up for right thumb CMC arthritis.  She had a previous injection on 07/12/2019.  The injection lasted 2 to 3 months but now pain is back full-time.  Pain wakes her up at night and bothers her with grip.  She has to use a brace and takes Aleve/ibuprofen but never at the same time.  Denies any numbness/tingling.  Denies any triggering of the finger..                ROS:  All systems reviewed are negative as they relate to the chief complaint within the history of present illness.  Patient denies fevers or chills.  Assessment & Plan: Visit Diagnoses:  1. Arthritis of carpometacarpal [facility]) joint of right thumb     Plan: Patient is a 69 year old female who presents complaining of right hand pain.  She has history of right thumb CMC arthritis.  She had relief with previous injection in November of last year.  She request another injection.  History and examination are consistent with right thumb CMC arthritis.  Under ultrasound guidance, a cortisone injection was administered into the right thumb CMC joint.  Patient tolerated the procedure well.  She will follow-up with the office as needed.  Follow-Up Instructions: No follow-ups on file.   Orders:  No orders of the defined types were placed in this encounter.  No orders of the defined types were placed in this encounter.     Procedures: Small Joint Inj: R thumb CMC on 12/03/2019 6:02 PM Indications: pain Details: 25 G needle, ultrasound-guided radial approach  Spinal Needle: No  Medications: 3 mL  lidocaine 1 %; 0.33 mL bupivacaine 0.25 %; 13.33 mg methylPREDNISolone acetate 40 MG/ML Outcome: tolerated well, no immediate complications Procedure, treatment alternatives, risks and benefits explained, specific risks discussed. Consent was given by the patient. Immediately prior to procedure a time out was called to verify the correct patient, procedure, equipment, support staff and site/side marked as required. Patient was prepped and draped in the usual sterile fashion.       Clinical Data: No additional findings.  Objective: Vital Signs: LMP 09/10/2003   Physical Exam:  Constitutional: Patient appears well-developed HEENT:  Head: Normocephalic Eyes:EOM are normal Neck: Normal range of motion Cardiovascular: Normal rate Pulmonary/chest: Effort normal Neurologic: Patient is alert Skin: Skin is warm Psychiatric: Patient has normal mood and affect  Ortho Exam:  Tender to palpation over the right thumb CMC joint.  Pain with right thumb circumduction and adduction.  No significant tenderness to palpation throughout the rest of the hand.  No tenderness to palpation over the A1 pulleys of the right hand.  No triggering is observed.  Negative Tinel's.  Negative Phalen's.  Specialty Comments:  No specialty comments available.  Imaging: No results found.   PMFS History: Patient Active Problem List   Diagnosis Date Noted  . Osteopenia 12/16/2013  . Postmenopausal hormone replacement therapy 12/16/2013  . Post menopausal  syndrome 12/16/2013  . Chronic cholecystitis with calculus 05/21/2011   Past Medical History:  Diagnosis Date  . ADHD (attention deficit hyperactivity disorder)   . Basal cell carcinoma    Leg and back of R arm   . GERD (gastroesophageal reflux disease)   . Hyperlipidemia   . Hypokalemia   . PMB (postmenopausal bleeding) 01/2002   endo polyp  . Squamous cell skin cancer   . Thyroid disease 2002   hypothyroidism    Family History  Problem Relation  Age of Onset  . Cancer Mother        multiple myeloma  . Cancer Father        oral  . Diabetes Father   . Diabetes Brother   . Diabetes Brother   . Heart disease Brother 9       secondary to drug use  . Diabetes Paternal Grandmother   . Osteoporosis Other     Past Surgical History:  Procedure Laterality Date  . CHOLECYSTECTOMY, LAPAROSCOPIC  06/07/11  . HAMMERTOE RECONSTRUCTION WITH WEIL OSTEOTOMY Right 09/25/2017   Procedure: Right 2-4 metatarsal Weil osteotomies and hammertoe corrections;  Surgeon: Wylene Simmer, MD;  Location: [facility];  Service: Orthopedics;  Laterality: Right;  . HYSTEROSCOPY  01/2002   endo polyp and focal simple hyperplasia  . POPLITEAL SYNOVIAL CYST EXCISION Left 2008 and 2010  . TONSILLECTOMY     Social History   Occupational History  . Not on file  Tobacco Use  . Smoking status: Never Smoker  . Smokeless tobacco: Never Used  Substance and Sexual Activity  . Alcohol use: No  . Drug use: No  . Sexual activity: Not Currently    Partners: Male    Birth control/protection: Post-menopausal

## 2020-03-07 NOTE — Progress Notes (Signed)
69 y.o. G33P2002 Widowed White or Caucasian Not Hispanic or Latino female here for annual exam.  No bleeding. Not sexually active.     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   Brother died in November 03, 2022 from bladder cancer at 97.  Patient's last menstrual period was 09/10/2003.          Sexually active: No.  The current method of family planning is post menopausal status.    Exercising: Yes.    weights, yardwork & walking Smoker:  no  Health Maintenance: Pap:  01-13-17 Neg, 12-16-13 Neg:Neg HR HPV History of abnormal Pap:  no MMG:  11/03/19 Bi-rads 1 neg  BMD: 07-16-2019, T score -2.0, FRAX 9.4/1.5% Colonoscopy: 10/19/2014 Polyps. Repeat in 5 years, appt 03-27-2020 (upper and lower endoscopies). TDaP:  11-03-2018 Gardasil: NA   reports that she has never smoked. She has never used smokeless tobacco. She reports that she does not drink alcohol and does not use drugs. Retired. Both sons are in [place]. She has a huge network here. 4 grand kids.  Past Medical History:  Diagnosis Date  . ADHD (attention deficit hyperactivity disorder)   . 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 more in rt thumb  . Basal cell carcinoma    Leg and back of R arm   . GERD (gastroesophageal reflux disease)   . Hyperlipidemia   . Hypokalemia   . PMB (postmenopausal bleeding) 01/2002   endo polyp  . Squamous cell skin cancer   . Thyroid disease 2000-11-03   hypothyroidism    Past Surgical History:  Procedure Laterality Date  . CHOLECYSTECTOMY, LAPAROSCOPIC  06/07/11  . HAMMERTOE RECONSTRUCTION WITH WEIL OSTEOTOMY Right 09/25/2017   Procedure: Right 2-4 metatarsal Weil osteotomies and hammertoe corrections;  Surgeon: Wylene Simmer, MD;  Location: [facility];  Service: Orthopedics;  Laterality: Right;  . HYSTEROSCOPY  01/2002   endo polyp and focal simple hyperplasia  . POPLITEAL SYNOVIAL CYST EXCISION Left Nov 03, 2006 and 2008-11-03  . TONSILLECTOMY      Current Outpatient Medications  Medication Sig Dispense Refill  . ACETAMINOPHEN-BUTALBITAL  (BUPAP) 50-650 MG TABS Take 50-650 mg by mouth as needed.     Marland Kitchen amphetamine-dextroamphetamine (ADDERALL XR, 30MG,) 30 MG 24 hr capsule Take 30 mg by mouth every morning.      Marland Kitchen aspirin 81 MG tablet Take 81 mg by mouth daily.      Marland Kitchen b complex vitamins capsule Take 1 capsule by mouth daily.    . Biotin (BIOTIN 5000) 5 MG CAPS Take by mouth.    Marland Kitchen buPROPion (WELLBUTRIN XL) 300 MG 24 hr tablet Take 300 mg by mouth.    . cholecalciferol (VITAMIN D) 1000 UNITS tablet Take 1,000 Units by mouth daily.      . folic acid (FOLVITE) 856 MCG tablet Take 400 mcg by mouth daily.      . Ginkgo Biloba 120 MG CAPS Take 120 mg by mouth.      . hydrochlorothiazide 25 MG tablet Take 25 mg by mouth daily.      . Melatonin 5 MG CAPS Take 8 mg by mouth at bedtime.     . Multiple Vitamin (MULTIVITAMIN) capsule Take 1 capsule by mouth daily.      Marland Kitchen thyroid (ARMOUR THYROID) 15 MG tablet Take 1 tablet by mouth daily.    Marland Kitchen thyroid (ARMOUR) 30 MG tablet Take 30 mg by mouth daily before breakfast.     No current facility-administered medications for this visit.    Family History  Problem  Relation Age of Onset  . Cancer Mother        multiple myeloma  . Cancer Father        oral  . Diabetes Father   . Diabetes Brother   . Diabetes Brother   . Heart disease Brother 73       secondary to drug use  . Diabetes Paternal Grandmother   . Osteoporosis Other     Review of Systems  Constitutional: Negative.   HENT: Negative.   Eyes: Negative.   Respiratory: Negative.   Cardiovascular: Negative.   Gastrointestinal: Negative.   Endocrine: Negative.   Genitourinary: Negative.   Musculoskeletal: Negative.   Skin: Negative.   Allergic/Immunologic: Negative.   Neurological: Negative.   Hematological: Negative.   Psychiatric/Behavioral: Negative.     Exam:   BP 106/68   Pulse 68   Resp 16   Ht 5' 1.75" (1.568 m)   Wt 117 lb (53.1 kg)   LMP 09/10/2003   BMI 21.57 kg/m   Weight change: _0 @ Height:    Height: 5' 1.75" (156.8 cm)  Ht Readings from Last 3 Encounters:  03/08/20 5' 1.75" (1.568 m)  07/12/19 5' 2.5" (1.588 m)  02/25/19 _1  (1.575 m)    General appearance: alert, cooperative and appears stated age Head: Normocephalic, without obvious abnormality, atraumatic Neck: no adenopathy, supple, symmetrical, trachea midline and thyroid normal to inspection and palpation Lungs: clear to auscultation bilaterally Cardiovascular: regular rate and rhythm Breasts: normal appearance, no masses or tenderness Abdomen: soft, non-tender; non distended,  no masses,  no organomegaly Extremities: extremities normal, atraumatic, no cyanosis or edema Skin: Skin color, texture, turgor normal. No rashes or lesions Lymph nodes: Cervical, supraclavicular, and axillary nodes normal. No abnormal inguinal nodes palpated Neurologic: Grossly normal   Pelvic: External genitalia:  no lesions              Urethra:  normal appearing urethra with no masses, tenderness or lesions              Bartholins and Skenes: normal                 Vagina: atrophic appearing vagina with normal color and discharge, no lesions              Cervix: no lesions               Bimanual Exam:  Uterus:  normal size, contour, position, consistency, mobility, non-tender              Adnexa: no mass, fullness, tenderness               Rectovaginal: Confirms               Anus:  normal sphincter tone, no lesions  Royal Hawthorn chaperoned for the exam.  A:  Well Woman with normal exam  Osteopenia, followed by her primary  P:   No pap needed  Mammogram UTD  Colonoscopy is scheduled  DEXA UTD  Discussed breast self exam  Discussed calcium and vit D intake

## 2020-03-08 ENCOUNTER — Ambulatory Visit (INDEPENDENT_AMBULATORY_CARE_PROVIDER_SITE_OTHER): Payer: Medicare PPO | Admitting: Obstetrics and Gynecology

## 2020-03-08 ENCOUNTER — Other Ambulatory Visit: Payer: Self-pay

## 2020-03-08 ENCOUNTER — Encounter: Payer: Self-pay | Admitting: Obstetrics and Gynecology

## 2020-03-08 VITALS — BP 106/68 | HR 68 | Resp 16 | Ht 61.75 in | Wt 117.0 lb

## 2020-03-08 DIAGNOSIS — Z01419 Encounter for gynecological examination (general) (routine) without abnormal findings: Secondary | ICD-10-CM | POA: Diagnosis not present

## 2020-03-08 NOTE — Patient Instructions (Signed)
EXERCISE AND DIET:  We recommended that you start or continue a regular exercise program for good health. Regular exercise means any activity that makes your heart beat faster and makes you sweat.  We recommend exercising at least 30 minutes per day at least 3 days a week, preferably 4 or 5.  We also recommend a diet low in fat and sugar.  Inactivity, poor dietary choices and obesity can cause diabetes, heart attack, stroke, and kidney damage, among others.    ALCOHOL AND SMOKING:  Women should limit their alcohol intake to no more than 7 drinks/beers/glasses of wine (combined, not each!) per week. Moderation of alcohol intake to this level decreases your risk of breast cancer and liver damage. And of course, no recreational drugs are part of a healthy lifestyle.  And absolutely no smoking or even second hand smoke. Most people know smoking can cause heart and lung diseases, but did you know it also contributes to weakening of your bones? Aging of your skin?  Yellowing of your teeth and nails?  CALCIUM AND VITAMIN D:  Adequate intake of calcium and Vitamin D are recommended.  The recommendations for exact amounts of these supplements seem to change often, but generally speaking 1,200 mg of calcium (between diet and supplement) and 800 units of Vitamin D per day seems prudent. Certain women may benefit from higher intake of Vitamin D.  If you are among these women, your doctor will have told you during your visit.    PAP SMEARS:  Pap smears, to check for cervical cancer or precancers,  have traditionally been done yearly, although recent scientific advances have shown that most women can have pap smears less often.  However, every woman still should have a physical exam from her gynecologist every year. It will include a breast check, inspection of the vulva and vagina to check for abnormal growths or skin changes, a visual exam of the cervix, and then an exam to evaluate the size and shape of the uterus and  ovaries.  And after 70 years of age, a rectal exam is indicated to check for rectal cancers. We will also provide age appropriate advice regarding health maintenance, like when you should have certain vaccines, screening for sexually transmitted diseases, bone density testing, colonoscopy, mammograms, etc.   MAMMOGRAMS:  All women over 40 years old should have a yearly mammogram. Many facilities now offer a "3D" mammogram, which may cost around $50 extra out of pocket. If possible,  we recommend you accept the option to have the 3D mammogram performed.  It both reduces the number of women who will be called back for extra views which then turn out to be normal, and it is better than the routine mammogram at detecting truly abnormal areas.    COLON CANCER SCREENING: Now recommend starting at age 45. At this time colonoscopy is not covered for routine screening until 50. There are take home tests that can be done between 45-49.   COLONOSCOPY:  Colonoscopy to screen for colon cancer is recommended for all women at age 50.  We know, you hate the idea of the prep.  We agree, BUT, having colon cancer and not knowing it is worse!!  Colon cancer so often starts as a polyp that can be seen and removed at colonscopy, which can quite literally save your life!  And if your first colonoscopy is normal and you have no family history of colon cancer, most women don't have to have it again for   10 years.  Once every ten years, you can do something that may end up saving your life, right?  We will be happy to help you get it scheduled when you are ready.  Be sure to check your insurance coverage so you understand how much it will cost.  It may be covered as a preventative service at no cost, but you should check your particular policy.      Breast Self-Awareness Breast self-awareness means being familiar with how your breasts look and feel. It involves checking your breasts regularly and reporting any changes to your  health care provider. Practicing breast self-awareness is important. A change in your breasts can be a sign of a serious medical problem. Being familiar with how your breasts look and feel allows you to find any problems early, when treatment is more likely to be successful. All women should practice breast self-awareness, including women who have had breast implants. How to do a breast self-exam One way to learn what is normal for your breasts and whether your breasts are changing is to do a breast self-exam. To do a breast self-exam: Look for Changes  1. Remove all the clothing above your waist. 2. Stand in front of a mirror in a room with good lighting. 3. Put your hands on your hips. 4. Push your hands firmly downward. 5. Compare your breasts in the mirror. Look for differences between them (asymmetry), such as: ? Differences in shape. ? Differences in size. ? Puckers, dips, and bumps in one breast and not the other. 6. Look at each breast for changes in your skin, such as: ? Redness. ? Scaly areas. 7. Look for changes in your nipples, such as: ? Discharge. ? Bleeding. ? Dimpling. ? Redness. ? A change in position. Feel for Changes Carefully feel your breasts for lumps and changes. It is best to do this while lying on your back on the floor and again while sitting or standing in the shower or tub with soapy water on your skin. Feel each breast in the following way:  Place the arm on the side of the breast you are examining above your head.  Feel your breast with the other hand.  Start in the nipple area and make  inch (2 cm) overlapping circles to feel your breast. Use the pads of your three middle fingers to do this. Apply light pressure, then medium pressure, then firm pressure. The light pressure will allow you to feel the tissue closest to the skin. The medium pressure will allow you to feel the tissue that is a little deeper. The firm pressure will allow you to feel the tissue  close to the ribs.  Continue the overlapping circles, moving downward over the breast until you feel your ribs below your breast.  Move one finger-width toward the center of the body. Continue to use the  inch (2 cm) overlapping circles to feel your breast as you move slowly up toward your collarbone.  Continue the up and down exam using all three pressures until you reach your armpit.  Write Down What You Find  Write down what is normal for each breast and any changes that you find. Keep a written record with breast changes or normal findings for each breast. By writing this information down, you do not need to depend only on memory for size, tenderness, or location. Write down where you are in your menstrual cycle, if you are still menstruating. If you are having trouble noticing differences   in your breasts, do not get discouraged. With time you will become more familiar with the variations in your breasts and more comfortable with the exam. How often should I examine my breasts? Examine your breasts every month. If you are breastfeeding, the best time to examine your breasts is after a feeding or after using a breast pump. If you menstruate, the best time to examine your breasts is 5-7 days after your period is over. During your period, your breasts are lumpier, and it may be more difficult to notice changes. When should I see my health care provider? See your health care provider if you notice:  A change in shape or size of your breasts or nipples.  A change in the skin of your breast or nipples, such as a reddened or scaly area.  Unusual discharge from your nipples.  A lump or thick area that was not there before.  Pain in your breasts.  Anything that concerns you.  

## 2020-03-29 ENCOUNTER — Ambulatory Visit: Payer: Medicare PPO | Admitting: Orthopedic Surgery

## 2020-03-29 DIAGNOSIS — M1811 Unilateral primary osteoarthritis of first carpometacarpal joint, right hand: Secondary | ICD-10-CM

## 2020-04-01 ENCOUNTER — Encounter: Payer: Self-pay | Admitting: Orthopedic Surgery

## 2020-04-01 DIAGNOSIS — M1811 Unilateral primary osteoarthritis of first carpometacarpal joint, right hand: Secondary | ICD-10-CM | POA: Diagnosis not present

## 2020-04-01 MED ORDER — LIDOCAINE HCL 1 % IJ SOLN
3.0000 mL | INTRAMUSCULAR | Status: AC | PRN
  Administered 2020-04-01: 3 mL

## 2020-04-01 MED ORDER — METHYLPREDNISOLONE ACETATE 40 MG/ML IJ SUSP
13.3300 mg | INTRAMUSCULAR | Status: AC | PRN
  Administered 2020-04-01: 13.33 mg via INTRA_ARTICULAR

## 2020-04-01 MED ORDER — BUPIVACAINE HCL 0.25 % IJ SOLN
0.3300 mL | INTRAMUSCULAR | Status: AC | PRN
  Administered 2020-04-01: .33 mL via INTRA_ARTICULAR

## 2020-04-01 NOTE — Progress Notes (Signed)
Office Visit Note   Patient: Catherine Peterson           Date of Birth: 10/18/1950           MRN: [id number] Visit Date: 03/29/2020 Requested by: Christain Sacramento, MD [address] [place],  [place] [address] PCP: Christain Sacramento, MD  Subjective: Chief Complaint  Patient presents with  . Right Hand - Pain    HPI: Catherine Peterson is a 69 y.o. female who presents to the office complaining of right hand pain.  She localizes pain to the base of the right thumb.  She has history of CMC arthritis for which she has received injections.  Her last injection was on 11/29/2019.  This lasted about 3 months.  She denies any numbness or tingling of the right hand.  She has developed gastritis from taking too many NSAIDs for which she had to receive an EGD.  She is not taking any NSAIDs any longer..                ROS: All systems reviewed are negative as they relate to the chief complaint within the history of present illness.  Patient denies fevers or chills.  Assessment & Plan: Visit Diagnoses:  1. Arthritis of carpometacarpal [facility]) joint of right thumb     Plan: Patient is a 69 year old female who presents complaint of right thumb pain.  She has history of CMC osteoarthritis.  Last injection was 11/29/2019.  Gave her 3 months of relief.  She request another injection.  Under ultrasound guidance an injection of cortisone was administered into the right first [facility] joint.  She tolerated the procedure well.  Follow-up as needed.  She wants to avoid surgery as long as possible.  Follow-Up Instructions: No follow-ups on file.   Orders:  No orders of the defined types were placed in this encounter.  No orders of the defined types were placed in this encounter.     Procedures: Small Joint Inj on 04/01/2020 10:14 PM Indications: pain Details: 25 G needle, ultrasound-guided radial approach  Spinal Needle: No  Medications: 3 mL lidocaine 1 %; 0.33 mL bupivacaine 0.25 %; 13.33 mg  methylPREDNISolone acetate 40 MG/ML Outcome: tolerated well, no immediate complications Procedure, treatment alternatives, risks and benefits explained, specific risks discussed. Consent was given by the patient. Immediately prior to procedure a time out was called to verify the correct patient, procedure, equipment, support staff and site/side marked as required. Patient was prepped and draped in the usual sterile fashion.       Clinical Data: No additional findings.  Objective: Vital Signs: LMP 09/10/2003   Physical Exam:  Constitutional: Patient appears well-developed HEENT:  Head: Normocephalic Eyes:EOM are normal Neck: Normal range of motion Cardiovascular: Normal rate Pulmonary/chest: Effort normal Neurologic: Patient is alert Skin: Skin is warm Psychiatric: Patient has normal mood and affect  Ortho Exam: Ortho exam demonstrates tenderness to palpation over the first [facility] joint of the right hand.  Pain is worse with thumb circumduction, thumb adduction, thumb abduction.  Negative Finkelstein's test.  Sensation intact through all fingers of the right hand.  No atrophy noted.  No significant tenderness over the ulnar side of the wrist.  Specialty Comments:  No specialty comments available.  Imaging: No results found.   PMFS History: Patient Active Problem List   Diagnosis Date Noted  . Acquired hypothyroidism 06/23/2018  . Attention deficit disorder 06/23/2018  . Generalized edema 06/23/2018  . 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 Tension type headache 06/23/2018  . Synovial cyst of left popliteal space 03/24/2018  . Osteopenia 12/16/2013  . Postmenopausal hormone replacement therapy 12/16/2013  . Post menopausal syndrome 12/16/2013  . Chronic cholecystitis with calculus 05/21/2011   Past Medical History:  Diagnosis Date  . ADHD (attention deficit hyperactivity disorder)   . 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 more in rt thumb  . Basal cell  carcinoma    Leg and back of R arm   . GERD (gastroesophageal reflux disease)   . Hyperlipidemia   . Hypokalemia   . PMB (postmenopausal bleeding) 01/2002   endo polyp  . Squamous cell skin cancer   . Thyroid disease 2002   hypothyroidism    Family History  Problem Relation Age of Onset  . Cancer Mother        multiple myeloma  . Cancer Father        oral  . Diabetes Father   . Diabetes Brother   . Diabetes Brother   . Heart disease Brother 25       secondary to drug use  . Diabetes Paternal Grandmother   . Osteoporosis Other     Past Surgical History:  Procedure Laterality Date  . CHOLECYSTECTOMY, LAPAROSCOPIC  06/07/11  . HAMMERTOE RECONSTRUCTION WITH WEIL OSTEOTOMY Right 09/25/2017   Procedure: Right 2-4 metatarsal Weil osteotomies and hammertoe corrections;  Surgeon: Wylene Simmer, MD;  Location: [place] [facility];  Service: Orthopedics;  Laterality: Right;  . HYSTEROSCOPY  01/2002   endo polyp and focal simple hyperplasia  . POPLITEAL SYNOVIAL CYST EXCISION Left 2008 and 2010  . TONSILLECTOMY     Social History   Occupational History  . Not on file  Tobacco Use  . Smoking status: Never Smoker  . Smokeless tobacco: Never Used  Vaping Use  . Vaping Use: Never used  Substance and Sexual Activity  . Alcohol use: No  . Drug use: No  . Sexual activity: Not Currently    Partners: Male    Birth control/protection: Post-menopausal

## 2020-06-26 ENCOUNTER — Telehealth: Payer: Self-pay | Admitting: Orthopedic Surgery

## 2020-06-26 NOTE — Telephone Encounter (Signed)
IC patient,she is coming now to sign release form. Advised CD is ready.

## 2020-06-26 NOTE — Telephone Encounter (Signed)
It is ready! I will take to front desk

## 2020-06-26 NOTE — Telephone Encounter (Signed)
Received call from patient requesting hand xrays to be sent to Dr. Amedeo Plenty. I advised patient need to sign authorization before we can release anything. She wants CD and will sign release. Advised her we will call her once CD is ready.

## 2020-06-26 NOTE — Telephone Encounter (Signed)
Please copy Hand xrays to CD for patient. I can call when ready. She is coming to sign release. Thanks!

## 2020-07-20 DIAGNOSIS — M79641 Pain in right hand: Secondary | ICD-10-CM | POA: Insufficient documentation

## 2021-03-15 ENCOUNTER — Encounter: Payer: Self-pay | Admitting: Obstetrics and Gynecology

## 2021-03-15 ENCOUNTER — Ambulatory Visit (INDEPENDENT_AMBULATORY_CARE_PROVIDER_SITE_OTHER): Payer: Medicare PPO | Admitting: Obstetrics and Gynecology

## 2021-03-15 ENCOUNTER — Other Ambulatory Visit: Payer: Self-pay

## 2021-03-15 VITALS — BP 126/64 | HR 77 | Ht 62.0 in | Wt 117.2 lb

## 2021-03-15 DIAGNOSIS — Z8739 Personal history of other diseases of the musculoskeletal system and connective tissue: Secondary | ICD-10-CM

## 2021-03-15 DIAGNOSIS — Z01419 Encounter for gynecological examination (general) (routine) without abnormal findings: Secondary | ICD-10-CM

## 2021-03-15 NOTE — Progress Notes (Signed)
70 y.o. [id number] Widowed White or Caucasian Not Hispanic or Latino female here for annual exam.  No vaginal bleeding. Not sexually active. Not currently interested in dating. Happy with her friend group.   No bowel or bladder c/o.     Patient's last menstrual period was 09/10/2003.          Sexually active: No.  The current method of family planning is post menopausal status.    Exercising: Yes.     Walking and yard work  Smoker:  no  Health Maintenance: Pap:   01-13-17 Neg, 12-16-13 Neg:Neg HR HPV History of abnormal Pap:  no MMG:  11/03/19 Bi-rads Bi-rads 1 Neg  BMD:   07-16-2019, T score -2.0, FRAX 9.4/1.5% Colonoscopy: 03/27/20: upper and lower endoscopy, she had one colon polyp. Needs f/u in 5 years.  TDaP:  2020 Gardasil: NA   reports that she has never smoked. She has never used smokeless tobacco. She reports that she does not drink alcohol and does not use drugs.Retired. Both sons are in [place]. 4 grand kids. (11,77, and 65 year old twins). She has a huge network here.   Past Medical History:  Diagnosis Date   ADHD (attention deficit hyperactivity disorder)    Arthritis    in thumbs. more in rt thumb   Basal cell carcinoma    Leg and back of R arm    GERD (gastroesophageal reflux disease)    Hyperlipidemia    Hypokalemia    PMB (postmenopausal bleeding) 01/2002   endo polyp   Squamous cell skin cancer    Thyroid disease 2002   hypothyroidism    Past Surgical History:  Procedure Laterality Date   CHOLECYSTECTOMY, LAPAROSCOPIC  06/07/2011   HAMMERTOE RECONSTRUCTION WITH WEIL OSTEOTOMY Right 09/25/2017   Procedure: Right 2-4 metatarsal Weil osteotomies and hammertoe corrections;  Surgeon: Wylene Simmer, MD;  Location: [facility];  Service: Orthopedics;  Laterality: Right;   HAND SURGERY Right    HYSTEROSCOPY  01/07/2002   endo polyp and focal simple hyperplasia   POPLITEAL SYNOVIAL CYST EXCISION Left 2008 and 2010   TONSILLECTOMY      Current Outpatient  Medications  Medication Sig Dispense Refill   ACETAMINOPHEN-BUTALBITAL 50-650 MG TABS Take 50-650 mg by mouth as needed.      amphetamine-dextroamphetamine (ADDERALL XR) 30 MG 24 hr capsule Take 30 mg by mouth every morning.       aspirin 81 MG tablet Take 81 mg by mouth daily.       b complex vitamins capsule Take 1 capsule by mouth daily.     Biotin 5 MG CAPS Take by mouth.     buPROPion (WELLBUTRIN XL) 300 MG 24 hr tablet Take 300 mg by mouth.     cholecalciferol (VITAMIN D) 1000 UNITS tablet Take 1,000 Units by mouth daily.       folic acid (FOLVITE) 784 MCG tablet Take 400 mcg by mouth daily.       Ginkgo Biloba 120 MG CAPS Take 120 mg by mouth.       hydrochlorothiazide 25 MG tablet Take 25 mg by mouth daily.       Melatonin 5 MG CAPS Take 8 mg by mouth at bedtime.      Multiple Vitamin (MULTIVITAMIN) capsule Take 1 capsule by mouth daily.       thyroid (ARMOUR) 15 MG tablet Take 1 tablet by mouth daily.     thyroid (ARMOUR) 30 MG tablet Take 30 mg by mouth daily before  breakfast.     No current facility-administered medications for this visit.    Family History  Problem Relation Age of Onset   Cancer Mother        multiple myeloma   Cancer Father        oral   Diabetes Father    Diabetes Brother    Diabetes Brother    Heart disease Brother 54       secondary to drug use   Diabetes Paternal Grandmother    Osteoporosis Other     Review of Systems  All other systems reviewed and are negative.  Exam:   BP 126/64   Pulse 77   Ht 5' 2"  (1.575 m)   Wt 117 lb 3.2 oz (53.2 kg)   LMP 09/10/2003   SpO2 99%   BMI 21.44 kg/m   Weight change: @WEIGHTCHANGE @ Height:   Height: 5' 2"  (157.5 cm)  Ht Readings from Last 3 Encounters:  03/15/21 5' 2"  (1.575 m)  03/08/20 5' 1.75" (1.568 m)  07/12/19 5' 2.5" (1.588 m)    General appearance: alert, cooperative and appears stated age Head: Normocephalic, without obvious abnormality, atraumatic Neck: no adenopathy, supple,  symmetrical, trachea midline and thyroid normal to inspection and palpation Breasts: normal appearance, no masses or tenderness Abdomen: soft, non-tender; non distended,  no masses,  no organomegaly Extremities: extremities normal, atraumatic, no cyanosis or edema Skin: Skin color, texture, turgor normal. No rashes or lesions Lymph nodes: Cervical, supraclavicular, and axillary nodes normal. No abnormal inguinal nodes palpated Neurologic: Grossly normal   Pelvic: External genitalia:  no lesions              Urethra:  normal appearing urethra with no masses, tenderness or lesions              Bartholins and Skenes: normal                 Vagina: atrophic appearing vagina with normal color and discharge, no lesions              Cervix: no lesions               Bimanual Exam:  Uterus:  normal size, contour, position, consistency, mobility, non-tender              Adnexa: no mass, fullness, tenderness               Rectovaginal: Confirms               Anus:  normal sphincter tone, no lesions  Gae Dry chaperoned for the exam.    1. Encounter for gynecological examination without abnormal finding Discussed breast self exam Discussed calcium and vit D intake Colonoscopy UTD No pap this year Mammogram is due, she will schedule  2. History of osteopenia She will get her DEXA with her primary

## 2021-03-28 DIAGNOSIS — M65311 Trigger thumb, right thumb: Secondary | ICD-10-CM | POA: Insufficient documentation

## 2021-03-29 DIAGNOSIS — M25562 Pain in left knee: Secondary | ICD-10-CM | POA: Insufficient documentation

## 2021-06-18 DIAGNOSIS — E785 Hyperlipidemia, unspecified: Secondary | ICD-10-CM | POA: Insufficient documentation

## 2021-07-04 ENCOUNTER — Ambulatory Visit
Admission: RE | Admit: 2021-07-04 | Discharge: 2021-07-04 | Disposition: A | Discharge status: Home / Self Care | Payer: Medicare PPO | Source: Ambulatory Visit | Attending: Family Medicine | Admitting: Family Medicine

## 2021-07-04 ENCOUNTER — Other Ambulatory Visit: Payer: Self-pay

## 2021-07-04 ENCOUNTER — Other Ambulatory Visit: Payer: Self-pay | Admitting: Family Medicine

## 2021-07-04 DIAGNOSIS — Z1231 Encounter for screening mammogram for malignant neoplasm of breast: Secondary | ICD-10-CM

## 2021-10-29 ENCOUNTER — Other Ambulatory Visit (HOSPITAL_COMMUNITY)
Admission: RE | Admit: 2021-10-29 | Discharge: 2021-10-29 | Disposition: A | Discharge status: Home / Self Care | Payer: Medicare PPO | Source: Ambulatory Visit | Attending: Obstetrics and Gynecology | Admitting: Obstetrics and Gynecology

## 2021-10-29 ENCOUNTER — Ambulatory Visit: Payer: Medicare PPO | Admitting: Obstetrics and Gynecology

## 2021-10-29 ENCOUNTER — Encounter: Payer: Self-pay | Admitting: Obstetrics and Gynecology

## 2021-10-29 ENCOUNTER — Other Ambulatory Visit: Payer: Self-pay

## 2021-10-29 VITALS — BP 100/68 | HR 72 | Resp 14 | Ht 61.5 in | Wt 116.5 lb

## 2021-10-29 DIAGNOSIS — N950 Postmenopausal bleeding: Secondary | ICD-10-CM | POA: Diagnosis present

## 2021-10-29 DIAGNOSIS — N882 Stricture and stenosis of cervix uteri: Secondary | ICD-10-CM | POA: Diagnosis not present

## 2021-10-29 DIAGNOSIS — Z01411 Encounter for gynecological examination (general) (routine) with abnormal findings: Secondary | ICD-10-CM | POA: Diagnosis not present

## 2021-10-29 DIAGNOSIS — Z124 Encounter for screening for malignant neoplasm of cervix: Secondary | ICD-10-CM | POA: Insufficient documentation

## 2021-10-29 NOTE — Progress Notes (Signed)
GYNECOLOGY  VISIT   HPI: 71 y.o.   Widowed White or Caucasian Not Hispanic or Latino  female   [phone] with Patient's last menstrual period was 09/10/2003.   here for postmenopausal bleeding. She starting spotting in August, it started out just occurring on occasion, then became daily, last week she noticed more red blood with a little clot. No significant pain.  Not sexually active.   GYNECOLOGIC HISTORY: Patient's last menstrual period was 09/10/2003. 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 more in rt thumb   Basal cell carcinoma    Leg and back of R arm    GERD (gastroesophageal reflux disease)    Hyperlipidemia    Hypokalemia    PMB (postmenopausal bleeding) 01/2002   endo polyp   Squamous cell skin cancer    Thyroid disease 2002   hypothyroidism    Past Surgical History:  Procedure Laterality Date   CHOLECYSTECTOMY, LAPAROSCOPIC  06/07/2011   HAMMERTOE RECONSTRUCTION WITH WEIL OSTEOTOMY Right 09/25/2017   Procedure: Right 2-4 metatarsal Weil osteotomies and hammertoe corrections;  Surgeon: Wylene Simmer, MD;  Location: [facility];  Service: Orthopedics;   Laterality: Right;   HAND SURGERY Right    HYSTEROSCOPY  01/07/2002   endo polyp and focal simple hyperplasia   POPLITEAL SYNOVIAL CYST EXCISION Left 2008 and 2010   TONSILLECTOMY      Current Outpatient Medications  Medication Sig Dispense Refill   ACETAMINOPHEN-BUTALBITAL 50-650 MG TABS Take 50-650 mg by mouth as needed.      amphetamine-dextroamphetamine (ADDERALL XR) 30 MG 24 hr capsule Take 30 mg by mouth every morning.       aspirin 81 MG tablet Take 81 mg by mouth daily.       b complex vitamins capsule Take 1 capsule by mouth daily.     Biotin 5 MG CAPS Take by mouth.     buPROPion (WELLBUTRIN XL) 300 MG 24 hr tablet Take 300 mg by mouth.     cholecalciferol (VITAMIN D) 1000 UNITS tablet Take 1,000 Units by mouth daily.       folic acid (FOLVITE) 841 MCG tablet Take 400 mcg by mouth daily.       Ginkgo Biloba 120 MG CAPS Take 120 mg by mouth.       hydrochlorothiazide 25 MG tablet Take 25 mg by mouth daily.       Melatonin 5 MG CAPS Take 8 mg by mouth  at bedtime.      Multiple Vitamin (MULTIVITAMIN) capsule Take 1 capsule by mouth daily.       thyroid (ARMOUR) 60 MG tablet Armour Thyroid 60 mg tablet  TAKE 1 TABLET BY MOUTH EVERY DAY     No current facility-administered medications for this visit.     ALLERGIES: Codeine  Family History  Problem Relation Age of Onset   Cancer Mother        multiple myeloma   Cancer Father        oral   Diabetes Father    Diabetes Brother    Diabetes Brother    Heart disease Brother 20       secondary to drug use   Diabetes Paternal Grandmother    Osteoporosis Other     Social History   Socioeconomic History   Marital status: Widowed    Spouse name: Not on file   Number of children: 2   Years of education: Not on file   Highest education level: Not on file  Occupational History   Not on file  Tobacco Use   Smoking status: Never   Smokeless tobacco: Never  Vaping Use   Vaping Use: Never used  Substance and Sexual  Activity   Alcohol use: No   Drug use: No   Sexual activity: Not Currently    Partners: Male    Birth control/protection: Post-menopausal  Other Topics Concern   Not on file  Social History Narrative   Husband died 11-08-2006. Not working outside the home.   Social Determinants of Health   Financial Resource Strain: Not on file  Food Insecurity: Not on file  Transportation Needs: Not on file  Physical Activity: Not on file  Stress: Not on file  Social Connections: Not on file  Intimate Partner Violence: Not on file    Review of Systems  Genitourinary:        Vaginal bleeding off and on since august    All other systems reviewed and are negative.  PHYSICAL EXAMINATION:    BP 100/68 (BP Location: Left Arm, Patient Position: Sitting, Cuff Size: Normal)    Pulse 72    Resp 14    Ht 5' 1.5" (1.562 m)    Wt 116 lb 8 oz (52.8 kg)    LMP 09/10/2003    BMI 21.66 kg/m     General appearance: alert, cooperative and appears stated age Abdomen: soft, non-tender; non distended, no masses,  no organomegaly  Pelvic: External genitalia:  no lesions              Urethra:  normal appearing urethra with no masses, tenderness or lesions              Bartholins and Skenes: normal                 Vagina: atrophic appearing vagina with normal color and discharge, no lesions              Cervix: no lesions and stenotic              Bimanual Exam:  Uterus:  normal size, contour, position, consistency, mobility, non-tender              Adnexa: no mass, fullness, tenderness               The risks of endometrial biopsy were reviewed and a consent was obtained.  A speculum was placed in the vagina and the cervix was cleansed  with betadine. A tenaculum was placed on the cervix and the cervix was dislated with the mini-dilators to the #3 hagar dilator. The pipelle was placed into the endometrial cavity. The uterus sounded to ~7 cm. The endometrial biopsy was performed, taking care to get a representative  sample, sampling 360 degrees of the uterine cavity. A moderated amount of tissue was obtained. The tenaculum and speculum were removed. There were no complications.    Chaperone was present for exam.  1. Postmenopausal bleeding - Surgical pathology( [facility]/ [facility]) - Cytology - PAP  2. Cervical stenosis (uterine cervix) Needed cervical dilation  3. Screening for cervical cancer - Cytology - PAP

## 2021-10-29 NOTE — Patient Instructions (Signed)

## 2021-10-30 LAB — SURGICAL PATHOLOGY

## 2021-10-31 ENCOUNTER — Telehealth: Payer: Self-pay

## 2021-10-31 ENCOUNTER — Encounter: Payer: Self-pay | Admitting: Obstetrics and Gynecology

## 2021-10-31 DIAGNOSIS — C801 Malignant (primary) neoplasm, unspecified: Secondary | ICD-10-CM

## 2021-10-31 NOTE — Telephone Encounter (Signed)
Referral order placed in Epic. 

## 2021-10-31 NOTE — Telephone Encounter (Signed)
Salvadore Dom, MD  [facility] Triage  Called patient and reviewed pathology report. Please place referral for Dr Valarie Cones in GYN Oncology for endometrial cancer.

## 2021-11-01 ENCOUNTER — Telehealth: Payer: Self-pay | Admitting: *Deleted

## 2021-11-01 LAB — CYTOLOGY - PAP

## 2021-11-01 NOTE — Telephone Encounter (Signed)
Appt scheduled with Dr. Berline Lopes for 11/05/21 at 8:15am.

## 2021-11-01 NOTE — Telephone Encounter (Signed)
Spoke with the patient and scheduled a new patient appt with Dr Berline Lopes on 2/27 at 8:15 am. Patient given the address and phone number for the clinic, along with the policy for mask and visitors

## 2021-11-02 ENCOUNTER — Encounter: Payer: Self-pay | Admitting: Gynecologic Oncology

## 2021-11-04 NOTE — H&P (View-Only) (Signed)
GYNECOLOGIC ONCOLOGY NEW PATIENT CONSULTATION   Patient Name: Catherine Peterson  Patient Age: 71 y.o. Date of Service: 11/05/21 Referring Provider: Dr. 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  We reviewed the nature of endometrial cancer and its recommended surgical staging, including total hysterectomy, bilateral salpingo-oophorectomy, and lymph node assessment. The patient is a suitable candidate for staging via a minimally invasive approach to surgery.  We reviewed that robotic assistance would be used to complete the surgery.   We discussed that most endometrial cancer is detected early and that decisions regarding adjuvant therapy will be made based on her final pathology.   Given her grade 2 histology, I recommend CT scan preoperatively to rule out metastatic disease.  We reviewed the sentinel lymph node technique. Risks and benefits of sentinel lymph node biopsy was reviewed. We reviewed the technique and ICG dye. The patient DOES NOT have an iodine allergy or known liver dysfunction. We reviewed the false negative rate (0.4%), and that 3% of patients with metastatic disease will not have it detected by SLN biopsy in endometrial cancer. A low risk of allergic reaction to the dye, <0.2% for ICG, has been reported. We also discussed that in the case of failed mapping, which occurs 40% of the time, a bilateral or unilateral lymphadenectomy will be performed at the surgeons discretion.   Potential benefits of sentinel nodes including a higher detection rate for metastasis due to ultrastaging and potential reduction in operative morbidity. However, there remains uncertainty as to the role for treatment of micrometastatic disease. Further, the benefit of operative morbidity associated with the SLN technique in endometrial cancer is not yet completely known. In  other patient populations (e.g. the cervical cancer population) there has been observed reductions in morbidity with SLN biopsy compared to pelvic lymphadenectomy. Lymphedema, nerve dysfunction and lymphocysts are all potential risks with the SLN technique as with complete lymphadenectomy. Additional risks to the patient include the risk of damage to an internal organ while operating in an altered view (e.g. the black and white image of the robotic fluorescence imaging mode).   The patient was consented for a robotic assisted hysterectomy, bilateral salpingo-oophorectomy, sentinel lymph node evaluation, possible lymph node dissection, possible laparotomy. The risks of surgery were discussed in detail and she understands these to include infection; wound separation; hernia; vaginal cuff separation, injury to adjacent organs such as bowel, bladder, blood vessels, ureters and nerves; bleeding which may require blood transfusion; anesthesia risk; thromboembolic events; possible death; unforeseen complications; possible need for re-exploration; medical complications such as heart attack, stroke, pleural effusion and pneumonia; and, if full lymphadenectomy is performed the risk of lymphedema and lymphocyst. The patient will receive DVT and antibiotic prophylaxis as indicated. She voiced a clear understanding. She had the opportunity to ask questions. Perioperative instructions were reviewed with her. Prescriptions for post-op medications were sent to her pharmacy of choice.  A copy of this note was sent to the patient's referring provider.   60 minutes of total time was spent for this patient encounter, including preparation, face-to-face counseling with the patient and coordination of care, and documentation of the encounter.   Jeral Pinch, MD  Division of Gynecologic Oncology  Department of Obstetrics and Gynecology  [facility]  ___________________________________________   Chief Complaint: Chief Complaint  Patient presents with   Adenocarcinoma [facility])   Endometrial adenocarcinoma  History of Present Illness:  Catherine Peterson is a 71 y.o. y.o. female who is seen in consultation at the request of Dr. Talbert Nan for an evaluation of endometrial cancer.  Patient reports spotting starting in August.  This was initially just very light pink spotting when she wiped.  This would happen for several days every month and then slowly increased in frequency.  Approximately 2 weeks ago, she had an increase in bleeding requiring the use of a pad, some passage of very small clots.  She denies any pain or cramping.  She saw Dr. Talbert Nan and underwent endometrial  biopsy on 2/202 showing FIGO grade 2 endometrioid endometrial adenocarcinoma.   She endorses a good appetite.  She has had some other medical issues including chronic sinusitis, for which she is following with ENT, as well as hand surgery in January.  She thinks she has lost 1 or 2 pounds over the last several months.  Denies any nausea or emesis.  Reports normal bowel and bladder function.  Patient reportedly had hysteroscopy with endometrial sampling back in 2003 showing polyp and focal simple hyperplasia.  She does not remember being put on any progesterone after that.  She was on HRT until 2005.  By chart review, she was on both Estrace as well as Provera.  The patient's family history is notable for uterine cancer in her maternal grandmother diagnosed in her mid 93s.  She did not undergo any treatment and ultimately did not die of her disease.  Patient has some intermittent lower extremity edema for which she uses hydrochlorothiazide.  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 more in rt thumb   Basal cell carcinoma    Leg and back of R arm    GERD (gastroesophageal reflux disease)    Hyperlipidemia     Hypokalemia    Lower leg edema    PMB (postmenopausal bleeding) 01/2002   endo polyp   Squamous cell skin cancer    Thyroid disease 2002   hypothyroidism     PAST SURGICAL HISTORY:  Past Surgical History:  Procedure Laterality Date   CHOLECYSTECTOMY, LAPAROSCOPIC  06/07/2011   HAMMERTOE RECONSTRUCTION WITH WEIL OSTEOTOMY Right 09/25/2017   Procedure: Right 2-4 metatarsal Weil osteotomies and hammertoe corrections;  Surgeon: Wylene Simmer, MD;  Location: [facility];  Service: Orthopedics;  Laterality: Right;   HAND SURGERY Right    HYSTEROSCOPY  01/07/2002   endo polyp and focal simple hyperplasia   POPLITEAL SYNOVIAL CYST EXCISION Left 2008 and 2010   TONSILLECTOMY      OB/GYN HISTORY:  OB History  Gravida Para Term Preterm AB Living  _0 0 0 2  SAB IAB Ectopic Multiple Live Births  0 0 0 0 2    # Outcome Date GA Lbr Len/2nd Weight Sex Delivery Anes PTL Lv  2 Term 6    M Vag-Spont   LIV  1 Term 7    M Vag-Spont   LIV    Patient's last menstrual period was 09/10/2003.  Age at menarche: 13 Age at menopause: 35 Hx of HRT: Yes, was on HRT until 2015 (estrace and provera, vagifem) Hx of STDs: Denies Last pap: 10/29/21 - Adenocarcinoma, endometrial History of abnormal pap smears: most recent  SCREENING STUDIES:  Last mammogram: 06/2021  Last colonoscopy: 2016 Last bone mineral density: 2020  MEDICATIONS: Outpatient  Encounter Medications as of 11/05/2021  Medication Sig   ACETAMINOPHEN-BUTALBITAL 50-650 MG TABS Take 50-650 mg by mouth as needed.    amphetamine-dextroamphetamine (ADDERALL XR) 30 MG 24 hr capsule Take 30 mg by mouth every morning.     aspirin 81 MG tablet Take 81 mg by mouth daily.     b complex vitamins capsule Take 1 capsule by mouth daily.   Biotin 5 MG CAPS Take by mouth.   buPROPion (WELLBUTRIN XL) 300 MG 24 hr tablet Take 300 mg by mouth.   cholecalciferol (VITAMIN D) 1000 UNITS tablet Take 2,000 Units by mouth daily.    folic acid (FOLVITE) 291 MCG tablet Take 400 mcg by mouth daily.     Ginkgo Biloba 120 MG CAPS Take 120 mg by mouth.     hydrochlorothiazide 25 MG tablet Take 25 mg by mouth daily. Per patient for fluid retention not HTN   Melatonin 5 MG CAPS Take 10 mg by mouth at bedtime.   Multiple Vitamin (MULTIVITAMIN) capsule Take 1 capsule by mouth daily.     OVER THE COUNTER MEDICATION Take 500 mg by mouth daily.   senna-docusate (SENOKOT-S) 8.6-50 MG tablet Take 2 tablets by mouth at bedtime. For AFTER surgery, do not take if having diarrhea   thyroid (ARMOUR) 60 MG tablet Armour Thyroid 60 mg tablet  TAKE 1 TABLET BY MOUTH EVERY DAY   traMADol (ULTRAM) 50 MG tablet Take 1 tablet (50 mg total) by mouth every 6 (six) hours as needed for severe pain. For AFTER surgery only, do not take and drive   Turmeric 916 MG CAPS Take by mouth.   vitamin k 100 MCG tablet Take 100 mcg by mouth daily.   No facility-administered encounter medications on file as of 11/05/2021.    ALLERGIES:  Allergies  Allergen Reactions   Codeine Nausea Only     FAMILY HISTORY:  Family History  Problem Relation Age of Onset   Cancer Mother        multiple myeloma   Cancer Father        oral   Diabetes Father    Diabetes Brother    Diabetes Brother    Heart disease Brother 21       secondary to drug use   Uterine cancer Maternal Grandmother    Diabetes Paternal Grandmother    Osteoporosis Other    Colon cancer Neg Hx      SOCIAL HISTORY:  Social Connections: Not on file    REVIEW OF SYSTEMS:  +ringing in ears, anxiety Denies appetite changes, fevers, chills, fatigue, unexplained weight changes. Denies hearing loss, neck lumps or masses, mouth sores or voice changes. Denies cough or wheezing.  Denies shortness of breath. Denies chest pain or palpitations. Denies leg swelling. Denies abdominal distention, pain, blood in stools, constipation, diarrhea, nausea, vomiting, or early satiety. Denies pain with  intercourse, dysuria, frequency, hematuria or incontinence. Denies hot flashes, pelvic pain, vaginal bleeding or vaginal discharge.   Denies joint pain, back pain or muscle pain/cramps. Denies itching, rash, or wounds. Denies dizziness, headaches, numbness or seizures. Denies swollen lymph nodes or glands, denies easy bruising or bleeding. Denies depression, confusion, or decreased concentration.  Physical Exam:  Vital Signs for this encounter:  Blood pressure 131/65, pulse 84, temperature 98 F (36.7 C), temperature source Oral, resp. rate 14, height 5' 1.5" (1.562 m), weight 116 lb 9.6 oz (52.9 kg), last menstrual period 09/10/2003, SpO2 100 %. Body mass index is 21.68 kg/m. General: Alert, oriented,  no acute distress.  HEENT: Normocephalic, atraumatic. Sclera anicteric.  Chest: Clear to auscultation bilaterally. No wheezes, rhonchi, or rales. Cardiovascular: Regular rate and rhythm, no murmurs, rubs, or gallops.  Abdomen: Normoactive bowel sounds. Soft, nondistended, nontender to palpation. No masses or hepatosplenomegaly appreciated. No palpable fluid wave.  Extremities: Grossly normal range of motion. Warm, well perfused. No edema bilaterally.  Skin: No rashes or lesions.  Lymphatics: No cervical, supraclavicular, or inguinal adenopathy.  GU:  Normal external female genitalia. No lesions. No discharge or bleeding.             Bladder/urethra:  No lesions or masses, well supported bladder             Vagina: Moderately atrophic, no lesions or masses noted.  No bleeding or discharge.             Cervix: Normal appearing, no lesions.  Atrophic.             Uterus:  Small, mobile, no parametrial involvement or nodularity.             Adnexa: No masses appreciated.  Rectal: Deferred.  LABORATORY AND RADIOLOGIC DATA:  Outside medical records were reviewed to synthesize the above history, along with the history and physical obtained during the visit.   Lab Results  Component Value  Date   WBC 5.9 05/30/2011   HGB 14.4 12/16/2013   HCT 36.3 05/30/2011   PLT 241 05/30/2011   GLUCOSE 92 09/19/2017   ALT 23 05/30/2011   AST 23 05/30/2011   NA 138 09/19/2017   K 3.6 09/19/2017   CL 101 09/19/2017   CREATININE 0.89 09/19/2017   BUN 12 09/19/2017   CO2 29 09/19/2017   INR 0.95 07/05/2009

## 2021-11-04 NOTE — Progress Notes (Signed)
GYNECOLOGIC ONCOLOGY NEW PATIENT CONSULTATION  ° °Patient Name: Catherine Peterson  °Patient Age: 71 y.o. °Date of Service: 11/05/21 °Referring Provider: Dr. Jill Jertson ° °Primary Care Provider: Wilson, Fred H, MD °Consulting Provider: Michoel Kunin, MD  ° °Assessment/Plan:  °Postmenopausal patient with grade 2 endometrial cancer. ° °We reviewed the nature of endometrial cancer and its recommended surgical staging, including total hysterectomy, bilateral salpingo-oophorectomy, and lymph node assessment. The patient is a suitable candidate for staging via a minimally invasive approach to surgery.  We reviewed that robotic assistance would be used to complete the surgery.  ° °We discussed that most endometrial cancer is detected early and that decisions regarding adjuvant therapy will be made based on her final pathology.  ° °Given her grade 2 histology, I recommend CT scan preoperatively to rule out metastatic disease. ° °We reviewed the sentinel lymph node technique. Risks and benefits of sentinel lymph node biopsy was reviewed. We reviewed the technique and ICG dye. The patient DOES NOT have an iodine allergy or known liver dysfunction. We reviewed the false negative rate (0.4%), and that 3% of patients with metastatic disease will not have it detected by SLN biopsy in endometrial cancer. A low risk of allergic reaction to the dye, <0.2% for ICG, has been reported. We also discussed that in the case of failed mapping, which occurs 40% of the time, a bilateral or unilateral lymphadenectomy will be performed at the surgeon’s discretion.  ° °Potential benefits of sentinel nodes including a higher detection rate for metastasis due to ultrastaging and potential reduction in operative morbidity. However, there remains uncertainty as to the role for treatment of micrometastatic disease. Further, the benefit of operative morbidity associated with the SLN technique in endometrial cancer is not yet completely known. In  other patient populations (e.g. the cervical cancer population) there has been observed reductions in morbidity with SLN biopsy compared to pelvic lymphadenectomy. Lymphedema, nerve dysfunction and lymphocysts are all potential risks with the SLN technique as with complete lymphadenectomy. Additional risks to the patient include the risk of damage to an internal organ while operating in an altered view (e.g. the black and white image of the robotic fluorescence imaging mode).  ° °The patient was consented for a robotic assisted hysterectomy, bilateral salpingo-oophorectomy, sentinel lymph node evaluation, possible lymph node dissection, possible laparotomy. The risks of surgery were discussed in detail and she understands these to include infection; wound separation; hernia; vaginal cuff separation, injury to adjacent organs such as bowel, bladder, blood vessels, ureters and nerves; bleeding which may require blood transfusion; anesthesia risk; thromboembolic events; possible death; unforeseen complications; possible need for re-exploration; medical complications such as heart attack, stroke, pleural effusion and pneumonia; and, if full lymphadenectomy is performed the risk of lymphedema and lymphocyst. The patient will receive DVT and antibiotic prophylaxis as indicated. She voiced a clear understanding. She had the opportunity to ask questions. Perioperative instructions were reviewed with her. Prescriptions for post-op medications were sent to her pharmacy of choice. ° °A copy of this note was sent to the patient's referring provider.  ° °60 minutes of total time was spent for this patient encounter, including preparation, face-to-face counseling with the patient and coordination of care, and documentation of the encounter. ° ° °Ilana Prezioso, MD  °Division of Gynecologic Oncology  °Department of Obstetrics and Gynecology  °[facility]  °___________________________________________   °Chief Complaint: °Chief Complaint  °Patient presents with  ° Adenocarcinoma (HCC)  ° Endometrial adenocarcinoma  ° ° °  History of Present Illness:  Catherine Peterson is a 71 y.o. y.o. female who is seen in consultation at the request of Dr. Talbert Nan for an evaluation of endometrial cancer.  Patient reports spotting starting in August.  This was initially just very light pink spotting when she wiped.  This would happen for several days every month and then slowly increased in frequency.  Approximately 2 weeks ago, she had an increase in bleeding requiring the use of a pad, some passage of very small clots.  She denies any pain or cramping.  She saw Dr. Talbert Nan and underwent endometrial  biopsy on 2/202 showing FIGO grade 2 endometrioid endometrial adenocarcinoma.   She endorses a good appetite.  She has had some other medical issues including chronic sinusitis, for which she is following with ENT, as well as hand surgery in January.  She thinks she has lost 1 or 2 pounds over the last several months.  Denies any nausea or emesis.  Reports normal bowel and bladder function.  Patient reportedly had hysteroscopy with endometrial sampling back in 2003 showing polyp and focal simple hyperplasia.  She does not remember being put on any progesterone after that.  She was on HRT until 2005.  By chart review, she was on both Estrace as well as Provera.  The patient's family history is notable for uterine cancer in her maternal grandmother diagnosed in her mid 93s.  She did not undergo any treatment and ultimately did not die of her disease.  Patient has some intermittent lower extremity edema for which she uses hydrochlorothiazide.  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 more in rt thumb   Basal cell carcinoma    Leg and back of R arm    GERD (gastroesophageal reflux disease)    Hyperlipidemia     Hypokalemia    Lower leg edema    PMB (postmenopausal bleeding) 01/2002   endo polyp   Squamous cell skin cancer    Thyroid disease 2002   hypothyroidism     PAST SURGICAL HISTORY:  Past Surgical History:  Procedure Laterality Date   CHOLECYSTECTOMY, LAPAROSCOPIC  06/07/2011   HAMMERTOE RECONSTRUCTION WITH WEIL OSTEOTOMY Right 09/25/2017   Procedure: Right 2-4 metatarsal Weil osteotomies and hammertoe corrections;  Surgeon: Wylene Simmer, MD;  Location: [facility];  Service: Orthopedics;  Laterality: Right;   HAND SURGERY Right    HYSTEROSCOPY  01/07/2002   endo polyp and focal simple hyperplasia   POPLITEAL SYNOVIAL CYST EXCISION Left 2008 and 2010   TONSILLECTOMY      OB/GYN HISTORY:  OB History  Gravida Para Term Preterm AB Living  _0 0 0 2  SAB IAB Ectopic Multiple Live Births  0 0 0 0 2    # Outcome Date GA Lbr Len/2nd Weight Sex Delivery Anes PTL Lv  2 Term 6    M Vag-Spont   LIV  1 Term 7    M Vag-Spont   LIV    Patient's last menstrual period was 09/10/2003.  Age at menarche: 13 Age at menopause: 35 Hx of HRT: Yes, was on HRT until 2015 (estrace and provera, vagifem) Hx of STDs: Denies Last pap: 10/29/21 - Adenocarcinoma, endometrial History of abnormal pap smears: most recent  SCREENING STUDIES:  Last mammogram: 06/2021  Last colonoscopy: 2016 Last bone mineral density: 2020  MEDICATIONS: Outpatient  Encounter Medications as of 11/05/2021  °Medication Sig  ° ACETAMINOPHEN-BUTALBITAL 50-650 MG TABS Take 50-650 mg by mouth as needed.   ° amphetamine-dextroamphetamine (ADDERALL XR) 30 MG 24 hr capsule Take 30 mg by mouth every morning.    ° aspirin 81 MG tablet Take 81 mg by mouth daily.    ° b complex vitamins capsule Take 1 capsule by mouth daily.  ° Biotin 5 MG CAPS Take by mouth.  ° buPROPion (WELLBUTRIN XL) 300 MG 24 hr tablet Take 300 mg by mouth.  ° cholecalciferol (VITAMIN D) 1000 UNITS tablet Take 2,000 Units by mouth daily.  °  folic acid (FOLVITE) 400 MCG tablet Take 400 mcg by mouth daily.    ° Ginkgo Biloba 120 MG CAPS Take 120 mg by mouth.    ° hydrochlorothiazide 25 MG tablet Take 25 mg by mouth daily. Per patient for fluid retention not HTN  ° Melatonin 5 MG CAPS Take 10 mg by mouth at bedtime.  ° Multiple Vitamin (MULTIVITAMIN) capsule Take 1 capsule by mouth daily.    ° OVER THE COUNTER MEDICATION Take 500 mg by mouth daily.  ° senna-docusate (SENOKOT-S) 8.6-50 MG tablet Take 2 tablets by mouth at bedtime. For AFTER surgery, do not take if having diarrhea  ° thyroid (ARMOUR) 60 MG tablet Armour Thyroid 60 mg tablet ° TAKE 1 TABLET BY MOUTH EVERY DAY  ° traMADol (ULTRAM) 50 MG tablet Take 1 tablet (50 mg total) by mouth every 6 (six) hours as needed for severe pain. For AFTER surgery only, do not take and drive  ° Turmeric 500 MG CAPS Take by mouth.  ° vitamin k 100 MCG tablet Take 100 mcg by mouth daily.  ° °No facility-administered encounter medications on file as of 11/05/2021.  ° ° °ALLERGIES:  °Allergies  °Allergen Reactions  ° Codeine Nausea Only  °  ° °FAMILY HISTORY:  °Family History  °Problem Relation Age of Onset  ° Cancer Mother   °     multiple myeloma  ° Cancer Father   °     oral  ° Diabetes Father   ° Diabetes Brother   ° Diabetes Brother   ° Heart disease Brother 44  °     secondary to drug use  ° Uterine cancer Maternal Grandmother   ° Diabetes Paternal Grandmother   ° Osteoporosis Other   ° Colon cancer Neg Hx   °  ° °SOCIAL HISTORY:  °Social Connections: Not on file  ° ° °REVIEW OF SYSTEMS:  °+ringing in ears, anxiety °Denies appetite changes, fevers, chills, fatigue, unexplained weight changes. °Denies hearing loss, neck lumps or masses, mouth sores or voice changes. °Denies cough or wheezing.  Denies shortness of breath. °Denies chest pain or palpitations. Denies leg swelling. °Denies abdominal distention, pain, blood in stools, constipation, diarrhea, nausea, vomiting, or early satiety. °Denies pain with  intercourse, dysuria, frequency, hematuria or incontinence. °Denies hot flashes, pelvic pain, vaginal bleeding or vaginal discharge.   °Denies joint pain, back pain or muscle pain/cramps. °Denies itching, rash, or wounds. °Denies dizziness, headaches, numbness or seizures. °Denies swollen lymph nodes or glands, denies easy bruising or bleeding. °Denies depression, confusion, or decreased concentration. ° °Physical Exam:  °Vital Signs for this encounter:  °Blood pressure 131/65, pulse 84, temperature 98 °F (36.7 °C), temperature source Oral, resp. rate 14, height 5' 1.5" (1.562 m), weight 116 lb 9.6 oz (52.9 kg), last menstrual period 09/10/2003, SpO2 100 %. °Body mass index is 21.68 kg/m². °General: Alert, oriented,   no acute distress.  HEENT: Normocephalic, atraumatic. Sclera anicteric.  Chest: Clear to auscultation bilaterally. No wheezes, rhonchi, or rales. Cardiovascular: Regular rate and rhythm, no murmurs, rubs, or gallops.  Abdomen: Normoactive bowel sounds. Soft, nondistended, nontender to palpation. No masses or hepatosplenomegaly appreciated. No palpable fluid wave.  Extremities: Grossly normal range of motion. Warm, well perfused. No edema bilaterally.  Skin: No rashes or lesions.  Lymphatics: No cervical, supraclavicular, or inguinal adenopathy.  GU:  Normal external female genitalia. No lesions. No discharge or bleeding.             Bladder/urethra:  No lesions or masses, well supported bladder             Vagina: Moderately atrophic, no lesions or masses noted.  No bleeding or discharge.             Cervix: Normal appearing, no lesions.  Atrophic.             Uterus:  Small, mobile, no parametrial involvement or nodularity.             Adnexa: No masses appreciated.  Rectal: Deferred.  LABORATORY AND RADIOLOGIC DATA:  Outside medical records were reviewed to synthesize the above history, along with the history and physical obtained during the visit.   Lab Results  Component Value  Date   WBC 5.9 05/30/2011   HGB 14.4 12/16/2013   HCT 36.3 05/30/2011   PLT 241 05/30/2011   GLUCOSE 92 09/19/2017   ALT 23 05/30/2011   AST 23 05/30/2011   NA 138 09/19/2017   K 3.6 09/19/2017   CL 101 09/19/2017   CREATININE 0.89 09/19/2017   BUN 12 09/19/2017   CO2 29 09/19/2017   INR 0.95 07/05/2009

## 2021-11-05 ENCOUNTER — Other Ambulatory Visit: Payer: Self-pay

## 2021-11-05 ENCOUNTER — Inpatient Hospital Stay: Payer: Medicare PPO | Admitting: Gynecologic Oncology

## 2021-11-05 ENCOUNTER — Encounter: Payer: Self-pay | Admitting: Gynecologic Oncology

## 2021-11-05 ENCOUNTER — Inpatient Hospital Stay: Payer: Medicare PPO | Attending: Gynecologic Oncology

## 2021-11-05 ENCOUNTER — Inpatient Hospital Stay (HOSPITAL_BASED_OUTPATIENT_CLINIC_OR_DEPARTMENT_OTHER): Payer: Medicare PPO | Admitting: Gynecologic Oncology

## 2021-11-05 VITALS — BP 131/65 | HR 84 | Temp 98.0°F | Resp 14 | Ht 61.5 in | Wt 116.6 lb

## 2021-11-05 DIAGNOSIS — E785 Hyperlipidemia, unspecified: Secondary | ICD-10-CM | POA: Diagnosis not present

## 2021-11-05 DIAGNOSIS — Z8049 Family history of malignant neoplasm of other genital organs: Secondary | ICD-10-CM | POA: Insufficient documentation

## 2021-11-05 DIAGNOSIS — C541 Malignant neoplasm of endometrium: Secondary | ICD-10-CM | POA: Insufficient documentation

## 2021-11-05 DIAGNOSIS — Z9229 Personal history of other drug therapy: Secondary | ICD-10-CM | POA: Insufficient documentation

## 2021-11-05 DIAGNOSIS — R600 Localized edema: Secondary | ICD-10-CM | POA: Diagnosis not present

## 2021-11-05 DIAGNOSIS — K219 Gastro-esophageal reflux disease without esophagitis: Secondary | ICD-10-CM | POA: Insufficient documentation

## 2021-11-05 DIAGNOSIS — F909 Attention-deficit hyperactivity disorder, unspecified type: Secondary | ICD-10-CM | POA: Diagnosis not present

## 2021-11-05 DIAGNOSIS — Z7982 Long term (current) use of aspirin: Secondary | ICD-10-CM | POA: Insufficient documentation

## 2021-11-05 DIAGNOSIS — F419 Anxiety disorder, unspecified: Secondary | ICD-10-CM | POA: Diagnosis not present

## 2021-11-05 DIAGNOSIS — Z79899 Other long term (current) drug therapy: Secondary | ICD-10-CM | POA: Insufficient documentation

## 2021-11-05 DIAGNOSIS — C801 Malignant (primary) neoplasm, unspecified: Secondary | ICD-10-CM

## 2021-11-05 DIAGNOSIS — M1990 Unspecified osteoarthritis, unspecified site: Secondary | ICD-10-CM | POA: Insufficient documentation

## 2021-11-05 DIAGNOSIS — Z780 Asymptomatic menopausal state: Secondary | ICD-10-CM | POA: Insufficient documentation

## 2021-11-05 DIAGNOSIS — E039 Hypothyroidism, unspecified: Secondary | ICD-10-CM | POA: Insufficient documentation

## 2021-11-05 DIAGNOSIS — J329 Chronic sinusitis, unspecified: Secondary | ICD-10-CM | POA: Diagnosis not present

## 2021-11-05 LAB — COMPREHENSIVE METABOLIC PANEL
ALT: 25 U/L (ref 0–44)
AST: 20 U/L (ref 15–41)
Albumin: 4 g/dL (ref 3.5–5.0)
Alkaline Phosphatase: 73 U/L (ref 38–126)
Anion gap: 8 (ref 5–15)
BUN: 16 mg/dL (ref 8–23)
CO2: 26 mmol/L (ref 22–32)
Calcium: 9.3 mg/dL (ref 8.9–10.3)
Chloride: 103 mmol/L (ref 98–111)
Creatinine, Ser: 0.92 mg/dL (ref 0.44–1.00)
GFR, Estimated: >60 mL/min (ref >60)
Glucose, Bld: 100 mg/dL — ABNORMAL HIGH (ref 70–99)
Potassium: 3.5 mmol/L (ref 3.5–5.1)
Sodium: 137 mmol/L (ref 135–145)
Total Bilirubin: 0.6 mg/dL (ref 0.3–1.2)
Total Protein: 6.8 g/dL (ref 6.5–8.1)

## 2021-11-05 MED ORDER — SENNOSIDES-DOCUSATE SODIUM 8.6-50 MG PO TABS: 2.0000 | ORAL_TABLET | Freq: Every day | ORAL | 0 refills | Status: DC

## 2021-11-05 MED ORDER — TRAMADOL HCL 50 MG PO TABS: 50.0000 mg | ORAL_TABLET | Freq: Four times a day (QID) | ORAL | 0 refills | Status: DC | PRN

## 2021-11-05 NOTE — Patient Instructions (Addendum)
Plan to have a CT scan before surgery and we will contact you with the results. We will be checking a lab today to make sure your kidney function is adequate in order to receive contrast with the CT scan.   Preparing for your Surgery  Plan for surgery on November 13, 2021  with Dr. Jeral Pinch at [facility] will be scheduled for robotic assisted total laparoscopic hysterectomy (removal of the uterus and cervix), bilateral salpingo-oophorectomy (removal of both ovaries and fallopian tubes), sentinel lymph node biopsy, possible lymph node dissection, possible laparotomy (larger incision on your abdomen if needed).   Pre-operative Testing -You will receive a phone call from presurgical testing at [facility] to arrange for a pre-operative appointment and lab work.  -Bring your insurance card, copy of an advanced directive if applicable, medication list  -At that visit, you will be asked to sign a consent for a possible blood transfusion in case a transfusion becomes necessary during surgery.  The need for a blood transfusion is rare but having consent is a necessary part of your care.     -You are fine to keep taking your baby aspirin (81 mg) with your last dose being the day before surgery.  -Please stop your supplements now. Do not take supplements such as fish oil (omega 3), red yeast rice, turmeric before your surgery. You want to avoid medications with aspirin in them including headache powders such as BC or Goody's), Excedrin migraine.  Day Before Surgery at [facility] will be asked to take in a light diet the day before surgery. You will be advised you can have clear liquids up until 3 hours before your surgery.    Eat a light diet the day before surgery.  Examples including soups, broths, toast, yogurt, mashed potatoes.  AVOID GAS PRODUCING FOODS. Things to avoid include carbonated beverages (fizzy beverages, sodas), raw fruits and raw vegetables (uncooked), or beans.    If your bowels are filled with gas, your surgeon will have difficulty visualizing your pelvic organs which increases your surgical risks.  Your role in recovery Your role is to become active as soon as directed by your doctor, while still giving yourself time to heal.  Rest when you feel tired. You will be asked to do the following in order to speed your recovery:  - Cough and breathe deeply. This helps to clear and expand your lungs and can prevent pneumonia after surgery.  - [place]. Do mild physical activity. Walking or moving your legs help your circulation and body functions return to normal. Do not try to get up or walk alone the first time after surgery.   -If you develop swelling on one leg or the other, pain in the back of your leg, redness/warmth in one of your legs, please call the office or go to the Emergency Room to have a doppler to rule out a blood clot. For shortness of breath, chest pain-seek care in the Emergency Room as soon as possible. - Actively manage your pain. Managing your pain lets you move in comfort. We will ask you to rate your pain on a scale of zero to 10. It is your responsibility to tell your doctor or nurse where and how much you hurt so your pain can be treated.  Special Considerations -If you are diabetic, you may be placed on insulin after surgery to have closer control over your blood sugars to promote healing and recovery.  This does not mean that you will be discharged on insulin.  If applicable, your oral antidiabetics will be resumed when you are tolerating a solid diet.  -Your final pathology results from surgery should be available around one week after surgery and the results will be relayed to you when available.  -Dr. Lahoma Crocker is the surgeon that assists your GYN Oncologist with surgery.  If you end up staying the night, the next day after your surgery you will either see Dr. Berline Lopes or Dr. Lahoma Crocker.  -FMLA  forms can be faxed to [phone] and please allow 5-7 business days for completion.  Pain Management After Surgery -You have been prescribed your pain medication and bowel regimen medications before surgery so that you can have these available when you are discharged from the hospital. The pain medication is for use ONLY AFTER surgery and a new prescription will not be given.   -Make sure that you have Tylenol and Ibuprofen (at home, 200-400 mg every six hours) at home to use on a regular basis after surgery for pain control. We recommend alternating the medications every hour to six hours since they work differently and are processed in the body differently for pain relief.  -Review the attached handout on narcotic use and their risks and side effects.   Bowel Regimen -You have been prescribed Sennakot-S to take nightly to prevent constipation especially if you are taking the narcotic pain medication intermittently.  It is important to prevent constipation and drink adequate amounts of liquids. You can stop taking this medication when you are not taking pain medication and you are back on your normal bowel routine.  Risks of Surgery Risks of surgery are low but include bleeding, infection, damage to surrounding structures, re-operation, blood clots, and very rarely death.   Blood Transfusion Information (For the consent to be signed before surgery)  We will be checking your blood type before surgery so in case of emergencies, we will know what type of blood you would need.                                            WHAT IS A BLOOD TRANSFUSION?  A transfusion is the replacement of blood or some of its parts. Blood is made up of multiple cells which provide different functions. Red blood cells carry oxygen and are used for blood loss replacement. White blood cells fight against infection. Platelets control bleeding. Plasma helps clot blood. Other blood products are available for  specialized needs, such as hemophilia or other clotting disorders. BEFORE THE TRANSFUSION  Who gives blood for transfusions?  You may be able to donate blood to be used at a later date on yourself (autologous donation). Relatives can be asked to donate blood. This is generally not any safer than if you have received blood from a stranger. The same precautions are taken to ensure safety when a relative's blood is donated. Healthy volunteers who are fully evaluated to make sure their blood is safe. This is blood bank blood. Transfusion therapy is the safest it has ever been in the practice of medicine. Before blood is taken from a donor, a complete history is taken to make sure that person has no history of diseases nor engages in risky social behavior (examples are intravenous drug use or sexual activity with multiple partners). The donor's travel history is screened  to minimize risk of transmitting infections, such as malaria. The donated blood is tested for signs of infectious diseases, such as HIV and hepatitis. The blood is then tested to be sure it is compatible with you in order to minimize the chance of a transfusion reaction. If you or a relative donates blood, this is often done in anticipation of surgery and is not appropriate for emergency situations. It takes many days to process the donated blood. RISKS AND COMPLICATIONS Although transfusion therapy is very safe and saves many lives, the main dangers of transfusion include:  Getting an infectious disease. Developing a transfusion reaction. This is an allergic reaction to something in the blood you were given. Every precaution is taken to prevent this. The decision to have a blood transfusion has been considered carefully by your caregiver before blood is given. Blood is not given unless the benefits outweigh the risks.  AFTER SURGERY INSTRUCTIONS  Return to work: 4-6 weeks if applicable  Activity: 1. Be up and out of the bed during the  day.  Take a nap if needed.  You may walk up steps but be careful and use the hand rail.  Stair climbing will tire you more than you think, you may need to stop part way and rest.   2. No lifting or straining for 6 weeks over 10 pounds. No pushing, pulling, straining for 6 weeks.  3. No driving for around 1 week(s).  Do not drive if you are taking narcotic pain medicine and make sure that your reaction time has returned.   4. You can shower as soon as the next day after surgery. Shower daily.  Use your regular soap and water (not directly on the incision) and pat your incision(s) dry afterwards; don't rub.  No tub baths or submerging your body in water until cleared by your surgeon. If you have the soap that was given to you by pre-surgical testing that was used before surgery, you do not need to use it afterwards because this can irritate your incisions.   5. No sexual activity and nothing in the vagina for 8 weeks.  6. You may experience a small amount of clear drainage from your incisions, which is normal.  If the drainage persists, increases, or changes color please call the office.  7. Do not use creams, lotions, or ointments such as neosporin on your incisions after surgery until advised by your surgeon because they can cause removal of the dermabond glue on your incisions.    8. You may experience vaginal spotting after surgery or around the 6-8 week mark from surgery when the stitches at the top of the vagina begin to dissolve.  The spotting is normal but if you experience heavy bleeding, call our office.  9. Take Tylenol or ibuprofen first for pain and only use narcotic pain medication for severe pain not relieved by the Tylenol or Ibuprofen.  Monitor your Tylenol intake to a max of 4,000 mg in a 24 hour period. You can alternate these medications after surgery.  Diet: 1. Low sodium Heart Healthy Diet is recommended but you are cleared to resume your normal (before surgery) diet after  your procedure.  2. It is safe to use a laxative, such as Miralax or Colace, if you have difficulty moving your bowels. You have been prescribed Sennakot-S to take at bedtime every evening after surgery to keep bowel movements regular and to prevent constipation.    Wound Care: 1. Keep clean and dry.  Shower daily.  Reasons to call the Doctor: Fever - Oral temperature greater than 100.4 degrees Fahrenheit Foul-smelling vaginal discharge Difficulty urinating Nausea and vomiting Increased pain at the site of the incision that is unrelieved with pain medicine. Difficulty breathing with or without chest pain New calf pain especially if only on one side Sudden, continuing increased vaginal bleeding with or without clots.   Contacts: For questions or concerns you should contact:  Dr. Jeral Pinch at [phone]  Joylene John, NP at [phone]  After Hours: call [phone] and have the GYN Oncologist paged/contacted (after 5 pm or on the weekends).  Messages sent via mychart are for non-urgent matters and are not responded to after hours so for urgent needs, please call the after hours number.

## 2021-11-05 NOTE — Progress Notes (Signed)
Patient here for new patient consultation with Dr. Jeral Pinch and for a pre-operative discussion prior to her scheduled surgery on November 13, 2021. She is scheduled for robotic assisted total laparoscopic hysterectomy, bilateral salpingo-oophorectomy, sentinel lymph node biopsy, possible lymph node dissection, possible laparotomy. The surgery was discussed in detail.  See after visit summary for additional details. Visual aids used to discuss items related to surgery including sequential compression stockings, foley catheter, IV pump, multi-modal pain regimen including tylenol, photo of the surgical robot, female reproductive system to discuss surgery in detail.      Discussed post-op pain management in detail including the aspects of the enhanced recovery pathway.  Advised her that a new prescription would be sent in for tramadol and it is only to be used for after her upcoming surgery. She has hydrocodone at home from her recent wrist surgery. Advised she could use this for moderate pain as needed instead of the tramadol and to monitor her tylenol intake. We discussed the use of tylenol post-op and to monitor for a maximum of 4,000 mg in a 24 hour period.  Also prescribed sennakot to be used after surgery and to hold if having loose stools.  Discussed bowel regimen in detail.     Discussed the use of heparin pre-op, SCDs, and measures to take at home to prevent DVT including frequent mobility.  Reportable signs and symptoms of DVT discussed. Post-operative instructions discussed and expectations for after surgery. Incisional care discussed as well including reportable signs and symptoms including erythema, drainage, wound separation.     10 minutes spent with the patient.  Verbalizing understanding of material discussed. No needs or concerns voiced at the end of the visit.   Advised patient to call for any needs.  Advised that her post-operative medications had been prescribed and could be picked up at  any time.    CT scan instructions discussed and contrast given. At the end of the visit, she was escorted to the lab for a metabolic panel prior to CT scan.   This appointment is included in the global surgical bundle as pre-operative teaching and has no charge.

## 2021-11-05 NOTE — Progress Notes (Signed)
Your procedure is scheduled on:    11/13/2021.    Report to [facility] Main  Entrance   Report to admitting at     0845 AM DO NOT [place], PICTURE ID OR WALLET DAY OF SURGERY.      Call this number if you have problems the morning of surgery [phone]    REMEMBER: NO  SOLID FOODS , CANDY, GUM OR MINTS AFTER [place] .       Marland Kitchen CLEAR LIQUIDS UNTIL  0800am              DAY OF SURGERY.      PLEASE FINISH ENSURE DRINK PER SURGEON ORDER  WHICH NEEDS TO BE COMPLETED AT    0800 am      MORNING OF SURGERY.   Eat a light diet the day before surgery.  Avoid gas producing foods.     CLEAR LIQUID DIET   Foods Allowed      WATER BLACK COFFEE ( SUGAR OK, NO MILK, CREAM OR CREAMER) REGULAR AND DECAF  TEA ( SUGAR OK NO MILK, CREAM, OR CREAMER) REGULAR AND DECAF  PLAIN JELLO ( NO RED)  FRUIT ICES ( NO RED, NO FRUIT PULP)  POPSICLES ( NO RED)  JUICE- APPLE, WHITE GRAPE AND WHITE CRANBERRY  SPORT DRINK LIKE GATORADE ( NO RED)  CLEAR BROTH ( VEGETABLE , CHICKEN OR BEEF)                                                                     BRUSH YOUR TEETH MORNING OF SURGERY AND RINSE YOUR MOUTH OUT, NO CHEWING GUM CANDY OR MINTS.     Take these medicines the morning of surgery with A SIP OF WATER:  wellbutrin, thyroid armour    DO NOT TAKE ANY DIABETIC MEDICATIONS DAY OF YOUR SURGERY                               You may not have any metal on your body including hair pins and              piercings  Do not wear jewelry, make-up, lotions, powders or perfumes, deodorant             Do not wear nail polish on your fingernails.              IF YOU ARE A FEMALE AND WANT TO SHAVE UNDER ARMS OR LEGS PRIOR TO SURGERY YOU MUST DO SO AT LEAST 48 HOURS PRIOR TO SURGERY.              Men may shave face and neck.   Do not bring valuables to the hospital. [place].  Contacts, dentures or bridgework may not  be worn into surgery.  Leave suitcase in the car. After surgery it may be brought to your room.     Patients discharged the day of surgery will not be allowed to drive home. IF YOU ARE HAVING SURGERY AND GOING HOME THE SAME DAY, YOU MUST HAVE AN ADULT  TO DRIVE YOU HOME AND BE WITH YOU FOR 24 HOURS. YOU MAY GO HOME BY TAXI OR UBER OR ORTHERWISE, BUT AN ADULT MUST ACCOMPANY YOU HOME AND STAY WITH YOU FOR 24 HOURS.                Please read over the following fact sheets you were given: _____________________________________________________________________  [facility] - Preparing for Surgery Before surgery, you can play an important role.  Because skin is not sterile, your skin needs to be as free of germs as possible.  You can reduce the number of germs on your skin by washing with CHG (chlorahexidine gluconate) soap before surgery.  CHG is an antiseptic cleaner which kills germs and bonds with the skin to continue killing germs even after washing. Please DO NOT use if you have an allergy to CHG or antibacterial soaps.  If your skin becomes reddened/irritated stop using the CHG and inform your nurse when you arrive at Short Stay. Do not shave (including legs and underarms) for at least 48 hours prior to the first CHG shower.  You may shave your face/neck. Please follow these instructions carefully:  1.  Shower with CHG Soap the night before surgery and the  morning of Surgery.  2.  If you choose to wash your hair, wash your hair first as usual with your  normal  shampoo.  3.  After you shampoo, rinse your hair and body thoroughly to remove the  shampoo.                           4.  Use CHG as you would any other liquid soap.  You can apply chg directly  to the skin and wash                       Gently with a scrungie or clean washcloth.  5.  Apply the CHG Soap to your body ONLY FROM THE NECK DOWN.   Do not use on face/ open                           Wound or open sores. Avoid contact with eyes,  ears mouth and genitals (private parts).                       Wash face,  Genitals (private parts) with your normal soap.             6.  Wash thoroughly, paying special attention to the area where your surgery  will be performed.  7.  Thoroughly rinse your body with warm water from the neck down.  8.  DO NOT shower/wash with your normal soap after using and rinsing off  the CHG Soap.                9.  Pat yourself dry with a clean towel.            10.  Wear clean pajamas.            11.  Place clean sheets on your bed the night of your first shower and do not  sleep with pets. Day of Surgery : Do not apply any lotions/deodorants the morning of surgery.  Please wear clean clothes to the hospital/surgery center.  FAILURE TO FOLLOW THESE INSTRUCTIONS MAY RESULT IN THE CANCELLATION OF YOUR SURGERY PATIENT SIGNATURE_________________________________  NURSE SIGNATURE__________________________________  ________________________________________________________________________

## 2021-11-05 NOTE — Progress Notes (Addendum)
Anesthesia Review:  PCP: Catherine Peterson in [place]  Cardiologist : none Chest x-ray : EKG : Echo : Stress test: Cardiac Cath :  Activity level: can do a flight of stairs without difficulty  Sleep Study/ CPAP : none  Fasting Blood Sugar :      / Checks Blood Sugar -- times a day:   Blood Thinner/ Instructions /Last Dose: ASA / Instructions/ Last Dose :   81 mg Aspirin  CMP  done on 11/05/21- in epic.  Takes HCTc for fluid at ankles related to thyroid not for blood pressure.

## 2021-11-05 NOTE — Patient Instructions (Signed)
Plan to have a CT scan before surgery and we will contact you with the results. We will be checking a lab today to make sure your kidney function is adequate in order to receive contrast with the CT scan.    Preparing for your Surgery   Plan for surgery on November 13, 2021  with Dr. Jeral Pinch at [place] will be scheduled for robotic assisted total laparoscopic hysterectomy (removal of the uterus and cervix), bilateral salpingo-oophorectomy (removal of both ovaries and fallopian tubes), sentinel lymph node biopsy, possible lymph node dissection, possible laparotomy (larger incision on your abdomen if needed).    Pre-operative Testing -You will receive a phone call from presurgical testing at [facility] to arrange for a pre-operative appointment and lab work.   -Bring your insurance card, copy of an advanced directive if applicable, medication list   -At that visit, you will be asked to sign a consent for a possible blood transfusion in case a transfusion becomes necessary during surgery.  The need for a blood transfusion is rare but having consent is a necessary part of your care.      -You are fine to keep taking your baby aspirin (81 mg) with your last dose being the day before surgery.   -Please stop your supplements now. Do not take supplements such as fish oil (omega 3), red yeast rice, turmeric before your surgery. You want to avoid medications with aspirin in them including headache powders such as BC or Goody's), Excedrin migraine.   Day Before Surgery at [place] will be asked to take in a light diet the day before surgery. You will be advised you can have clear liquids up until 3 hours before your surgery.     Eat a light diet the day before surgery.  Examples including soups, broths, toast, yogurt, mashed potatoes.  AVOID GAS PRODUCING FOODS. Things to avoid include carbonated beverages (fizzy beverages, sodas), raw fruits and raw vegetables (uncooked),  or beans.    If your bowels are filled with gas, your surgeon will have difficulty visualizing your pelvic organs which increases your surgical risks.   Your role in recovery Your role is to become active as soon as directed by your doctor, while still giving yourself time to heal.  Rest when you feel tired. You will be asked to do the following in order to speed your recovery:   - Cough and breathe deeply. This helps to clear and expand your lungs and can prevent pneumonia after surgery.  - [employer]. Do mild physical activity. Walking or moving your legs help your circulation and body functions return to normal. Do not try to get up or walk alone the first time after surgery.   -If you develop swelling on one leg or the other, pain in the back of your leg, redness/warmth in one of your legs, please call the office or go to the Emergency Room to have a doppler to rule out a blood clot. For shortness of breath, chest pain-seek care in the Emergency Room as soon as possible. - Actively manage your pain. Managing your pain lets you move in comfort. We will ask you to rate your pain on a scale of zero to 10. It is your responsibility to tell your doctor or nurse where and how much you hurt so your pain can be treated.   Special Considerations -If you are diabetic, you may be placed on insulin after surgery  to have closer control over your blood sugars to promote healing and recovery.  This does not mean that you will be discharged on insulin.  If applicable, your oral antidiabetics will be resumed when you are tolerating a solid diet.   -Your final pathology results from surgery should be available around one week after surgery and the results will be relayed to you when available.   -Dr. Lahoma Crocker is the surgeon that assists your GYN Oncologist with surgery.  If you end up staying the night, the next day after your surgery you will either see Dr. Berline Lopes or Dr. Lahoma Crocker.   -FMLA forms can be faxed to [phone] and please allow 5-7 business days for completion.   Pain Management After Surgery -You have been prescribed your pain medication and bowel regimen medications before surgery so that you can have these available when you are discharged from the hospital. The pain medication is for use ONLY AFTER surgery and a new prescription will not be given.    -Make sure that you have Tylenol and Ibuprofen (at home, 200-400 mg every six hours) at home to use on a regular basis after surgery for pain control. We recommend alternating the medications every hour to six hours since they work differently and are processed in the body differently for pain relief.   -Review the attached handout on narcotic use and their risks and side effects.    Bowel Regimen -You have been prescribed Sennakot-S to take nightly to prevent constipation especially if you are taking the narcotic pain medication intermittently.  It is important to prevent constipation and drink adequate amounts of liquids. You can stop taking this medication when you are not taking pain medication and you are back on your normal bowel routine.   Risks of Surgery Risks of surgery are low but include bleeding, infection, damage to surrounding structures, re-operation, blood clots, and very rarely death.     Blood Transfusion Information (For the consent to be signed before surgery)   We will be checking your blood type before surgery so in case of emergencies, we will know what type of blood you would need.                                             WHAT IS A BLOOD TRANSFUSION?   A transfusion is the replacement of blood or some of its parts. Blood is made up of multiple cells which provide different functions. Red blood cells carry oxygen and are used for blood loss replacement. White blood cells fight against infection. Platelets control bleeding. Plasma helps clot blood. Other blood  products are available for specialized needs, such as hemophilia or other clotting disorders. BEFORE THE TRANSFUSION  Who gives blood for transfusions?  You may be able to donate blood to be used at a later date on yourself (autologous donation). Relatives can be asked to donate blood. This is generally not any safer than if you have received blood from a stranger. The same precautions are taken to ensure safety when a relative's blood is donated. Healthy volunteers who are fully evaluated to make sure their blood is safe. This is blood bank blood. Transfusion therapy is the safest it has ever been in the practice of medicine. Before blood is taken from a donor, a complete history is taken to make sure that person has  no history of diseases nor engages in risky social behavior (examples are intravenous drug use or sexual activity with multiple partners). The donor's travel history is screened to minimize risk of transmitting infections, such as malaria. The donated blood is tested for signs of infectious diseases, such as HIV and hepatitis. The blood is then tested to be sure it is compatible with you in order to minimize the chance of a transfusion reaction. If you or a relative donates blood, this is often done in anticipation of surgery and is not appropriate for emergency situations. It takes many days to process the donated blood. RISKS AND COMPLICATIONS Although transfusion therapy is very safe and saves many lives, the main dangers of transfusion include:  Getting an infectious disease. Developing a transfusion reaction. This is an allergic reaction to something in the blood you were given. Every precaution is taken to prevent this. The decision to have a blood transfusion has been considered carefully by your caregiver before blood is given. Blood is not given unless the benefits outweigh the risks.   AFTER SURGERY INSTRUCTIONS   Return to work: 4-6 weeks if applicable   Activity: 1. Be up  and out of the bed during the day.  Take a nap if needed.  You may walk up steps but be careful and use the hand rail.  Stair climbing will tire you more than you think, you may need to stop part way and rest.    2. No lifting or straining for 6 weeks over 10 pounds. No pushing, pulling, straining for 6 weeks.   3. No driving for around 1 week(s).  Do not drive if you are taking narcotic pain medicine and make sure that your reaction time has returned.    4. You can shower as soon as the next day after surgery. Shower daily.  Use your regular soap and water (not directly on the incision) and pat your incision(s) dry afterwards; don't rub.  No tub baths or submerging your body in water until cleared by your surgeon. If you have the soap that was given to you by pre-surgical testing that was used before surgery, you do not need to use it afterwards because this can irritate your incisions.    5. No sexual activity and nothing in the vagina for 8 weeks.   6. You may experience a small amount of clear drainage from your incisions, which is normal.  If the drainage persists, increases, or changes color please call the office.   7. Do not use creams, lotions, or ointments such as neosporin on your incisions after surgery until advised by your surgeon because they can cause removal of the dermabond glue on your incisions.     8. You may experience vaginal spotting after surgery or around the 6-8 week mark from surgery when the stitches at the top of the vagina begin to dissolve.  The spotting is normal but if you experience heavy bleeding, call our office.   9. Take Tylenol or ibuprofen first for pain and only use narcotic pain medication for severe pain not relieved by the Tylenol or Ibuprofen.  Monitor your Tylenol intake to a max of 4,000 mg in a 24 hour period. You can alternate these medications after surgery.   Diet: 1. Low sodium Heart Healthy Diet is recommended but you are cleared to resume your  normal (before surgery) diet after your procedure.   2. It is safe to use a laxative, such as Miralax or Colace,  if you have difficulty moving your bowels. You have been prescribed Sennakot-S to take at bedtime every evening after surgery to keep bowel movements regular and to prevent constipation.     Wound Care: 1. Keep clean and dry.  Shower daily.   Reasons to call the Doctor: Fever - Oral temperature greater than 100.4 degrees Fahrenheit Foul-smelling vaginal discharge Difficulty urinating Nausea and vomiting Increased pain at the site of the incision that is unrelieved with pain medicine. Difficulty breathing with or without chest pain New calf pain especially if only on one side Sudden, continuing increased vaginal bleeding with or without clots.   Contacts: For questions or concerns you should contact:   Dr. Jeral Pinch at [phone]   Joylene John, NP at [phone]   After Hours: call [phone] and have the GYN Oncologist paged/contacted (after 5 pm or on the weekends).   Messages sent via mychart are for non-urgent matters and are not responded to after hours so for urgent needs, please call the after hours number.

## 2021-11-06 ENCOUNTER — Other Ambulatory Visit: Payer: Self-pay

## 2021-11-06 ENCOUNTER — Encounter (HOSPITAL_COMMUNITY)
Admission: RE | Admit: 2021-11-06 | Discharge: 2021-11-06 | Disposition: A | Discharge status: Home / Self Care | Payer: Medicare PPO | Source: Ambulatory Visit | Attending: Gynecologic Oncology | Admitting: Gynecologic Oncology

## 2021-11-06 ENCOUNTER — Encounter (HOSPITAL_COMMUNITY): Payer: Self-pay

## 2021-11-06 DIAGNOSIS — C541 Malignant neoplasm of endometrium: Secondary | ICD-10-CM | POA: Insufficient documentation

## 2021-11-06 DIAGNOSIS — Z01812 Encounter for preprocedural laboratory examination: Secondary | ICD-10-CM | POA: Diagnosis not present

## 2021-11-06 HISTORY — DX: Hypothyroidism, unspecified: E03.9

## 2021-11-06 HISTORY — DX: Depression, unspecified: F32.A

## 2021-11-06 LAB — CBC
HCT: 41.4 % (ref 36.0–46.0)
Hemoglobin: 14.3 g/dL (ref 12.0–15.0)
MCH: 31.4 pg (ref 26.0–34.0)
MCHC: 34.5 g/dL (ref 30.0–36.0)
MCV: 91 fL (ref 80.0–100.0)
Platelets: 269 10*3/uL (ref 150–400)
RBC: 4.55 MIL/uL (ref 3.87–5.11)
RDW: 12.6 % (ref 11.5–15.5)
WBC: 5.9 10*3/uL (ref 4.0–10.5)
nRBC: 0 % (ref 0.0–0.2)

## 2021-11-08 DIAGNOSIS — J320 Chronic maxillary sinusitis: Secondary | ICD-10-CM | POA: Insufficient documentation

## 2021-11-09 ENCOUNTER — Other Ambulatory Visit: Payer: Self-pay

## 2021-11-09 ENCOUNTER — Ambulatory Visit (HOSPITAL_COMMUNITY)
Admission: RE | Admit: 2021-11-09 | Discharge: 2021-11-09 | Disposition: A | Discharge status: Home / Self Care | Payer: Medicare PPO | Source: Ambulatory Visit | Attending: Gynecologic Oncology | Admitting: Gynecologic Oncology

## 2021-11-09 DIAGNOSIS — N83201 Unspecified ovarian cyst, right side: Secondary | ICD-10-CM | POA: Diagnosis not present

## 2021-11-09 DIAGNOSIS — C801 Malignant (primary) neoplasm, unspecified: Secondary | ICD-10-CM

## 2021-11-09 DIAGNOSIS — I700 Atherosclerosis of aorta: Secondary | ICD-10-CM | POA: Insufficient documentation

## 2021-11-09 DIAGNOSIS — C539 Malignant neoplasm of cervix uteri, unspecified: Secondary | ICD-10-CM | POA: Insufficient documentation

## 2021-11-09 MED ORDER — IOHEXOL 300 MG/ML  SOLN
100.0000 mL | Freq: Once | INTRAMUSCULAR | Status: AC | PRN
  Administered 2021-11-09: 100 mL via INTRAVENOUS

## 2021-11-12 ENCOUNTER — Telehealth: Payer: Self-pay

## 2021-11-12 ENCOUNTER — Encounter (HOSPITAL_COMMUNITY): Payer: Self-pay | Admitting: Gynecologic Oncology

## 2021-11-12 NOTE — Anesthesia Preprocedure Evaluation (Addendum)
Anesthesia Evaluation  ?Patient identified by MRN, date of birth, ID band ?Patient awake ? ? ? ?Reviewed: ?Allergy & Precautions, NPO status , Patient's Chart, lab work & pertinent test results ? ?Airway ?Mallampati: I ? ?TM Distance: >3 FB ?Neck ROM: Full ? ? ? Dental ?no notable dental hx. ?(+) Teeth Intact, Dental Advisory Given ?  ?Pulmonary ? ?  ?Pulmonary exam normal ?breath sounds clear to auscultation ? ? ? ? ? ? Cardiovascular ?negative cardio ROS ?Normal cardiovascular exam ?Rhythm:Regular Rate:Normal ? ? ?  ?Neuro/Psych ?PSYCHIATRIC DISORDERS Anxiety Depression   ? GI/Hepatic ?Neg liver ROS,   ?Endo/Other  ?Hypothyroidism  ? Renal/GU ?Lab Results ?     Component                Value               Date                 ?     CREATININE               0.92                11/05/2021           ?     K                        3.5                 11/05/2021           ?        ?  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 ? ?  ?Musculoskeletal ? ?(+) Arthritis ,  ? Abdominal ?  ?Peds ? Hematology ?Lab Results ?     Component                Value               Date                   ?     HGB                      14.3                11/06/2021           ?     HCT                      41.4                11/06/2021          ?     PLT                      269                 11/06/2021           ?   ?Anesthesia Other Findings ?All: Codeine and Coumadin ? Reproductive/Obstetrics ? ?  ? ? ? ? ? ? ? ? ? ? ? ? ? ?  ?  ? ? ? ? ? ? ? ?Anesthesia Physical ?Anesthesia Plan ? ?ASA: 3 ? ?Anesthesia Plan: General  ? ?Post-op Pain Management: Lidocaine infusion* and Dilaudid IV  ? ?Induction: Intravenous ? ?PONV Risk Score and Plan: 3 and Treatment may vary due to  age or medical condition, Ondansetron, Midazolam and Dexamethasone ? ?Airway Management Planned: Oral ETT ? ?Additional Equipment: None ? ?Intra-op Plan:  ? ?Post-operative Plan: Extubation in OR ? ?Informed Consent: I have  reviewed the patients History and Physical, chart, labs and discussed the procedure including the risks, benefits and alternatives for the proposed anesthesia with the patient or authorized representative who has indicated his/her understanding and acceptance.  ? ? ? ?Dental advisory given ? ?Plan Discussed with: CRNA and Anesthesiologist ? ?Anesthesia Plan Comments:   ? ? ? ? ? ?Anesthesia Quick Evaluation ? ?

## 2021-11-12 NOTE — Telephone Encounter (Signed)
Telephone call to check on pre-operative status.  Patient compliant with pre-operative instructions.  Reinforced nothing to eat after midnight. Clear liquids until 0800. Patient to arrive at 0845.  No questions or concerns voiced.  Instructed to call for any needs.  

## 2021-11-13 ENCOUNTER — Encounter (HOSPITAL_COMMUNITY)
Admission: RE | Disposition: A | Discharge status: Home / Self Care | Payer: Self-pay | Source: Ambulatory Visit | Attending: Gynecologic Oncology

## 2021-11-13 ENCOUNTER — Encounter (HOSPITAL_COMMUNITY): Payer: Self-pay | Admitting: Gynecologic Oncology

## 2021-11-13 ENCOUNTER — Ambulatory Visit (HOSPITAL_BASED_OUTPATIENT_CLINIC_OR_DEPARTMENT_OTHER): Payer: Medicare PPO | Admitting: Anesthesiology

## 2021-11-13 ENCOUNTER — Ambulatory Visit (HOSPITAL_COMMUNITY)
Admission: RE | Admit: 2021-11-13 | Discharge: 2021-11-13 | Disposition: A | Discharge status: Home / Self Care | Payer: Medicare PPO | Source: Ambulatory Visit | Attending: Gynecologic Oncology | Admitting: Gynecologic Oncology

## 2021-11-13 ENCOUNTER — Other Ambulatory Visit: Payer: Self-pay

## 2021-11-13 ENCOUNTER — Ambulatory Visit (HOSPITAL_COMMUNITY): Payer: Medicare PPO | Admitting: Physician Assistant

## 2021-11-13 DIAGNOSIS — F418 Other specified anxiety disorders: Secondary | ICD-10-CM | POA: Diagnosis not present

## 2021-11-13 DIAGNOSIS — C541 Malignant neoplasm of endometrium: Secondary | ICD-10-CM | POA: Insufficient documentation

## 2021-11-13 DIAGNOSIS — Z79890 Hormone replacement therapy (postmenopausal): Secondary | ICD-10-CM | POA: Diagnosis not present

## 2021-11-13 DIAGNOSIS — Z780 Asymptomatic menopausal state: Secondary | ICD-10-CM | POA: Insufficient documentation

## 2021-11-13 DIAGNOSIS — M1990 Unspecified osteoarthritis, unspecified site: Secondary | ICD-10-CM

## 2021-11-13 DIAGNOSIS — E039 Hypothyroidism, unspecified: Secondary | ICD-10-CM | POA: Insufficient documentation

## 2021-11-13 DIAGNOSIS — X58XXXA Exposure to other specified factors, initial encounter: Secondary | ICD-10-CM | POA: Diagnosis not present

## 2021-11-13 DIAGNOSIS — J329 Chronic sinusitis, unspecified: Secondary | ICD-10-CM | POA: Diagnosis not present

## 2021-11-13 DIAGNOSIS — R600 Localized edema: Secondary | ICD-10-CM | POA: Insufficient documentation

## 2021-11-13 DIAGNOSIS — Z8049 Family history of malignant neoplasm of other genital organs: Secondary | ICD-10-CM | POA: Insufficient documentation

## 2021-11-13 DIAGNOSIS — S3141XA Laceration without foreign body of vagina and vulva, initial encounter: Secondary | ICD-10-CM | POA: Insufficient documentation

## 2021-11-13 DIAGNOSIS — Z79899 Other long term (current) drug therapy: Secondary | ICD-10-CM | POA: Diagnosis not present

## 2021-11-13 HISTORY — PX: ROBOTIC ASSISTED TOTAL HYSTERECTOMY WITH BILATERAL SALPINGO OOPHERECTOMY: SHX6086

## 2021-11-13 HISTORY — PX: SENTINEL NODE BIOPSY: SHX6608

## 2021-11-13 LAB — TYPE AND SCREEN
ABO/RH(D): A POS
Antibody Screen: NEGATIVE

## 2021-11-13 LAB — ABO/RH: ABO/RH(D): A POS

## 2021-11-13 SURGERY — HYSTERECTOMY, TOTAL, ROBOT-ASSISTED, LAPAROSCOPIC, WITH BILATERAL SALPINGO-OOPHORECTOMY
Anesthesia: General

## 2021-11-13 MED ORDER — OXYCODONE HCL 5 MG PO TABS
ORAL_TABLET | ORAL | Status: AC
  Filled 2021-11-13: qty 1

## 2021-11-13 MED ORDER — STERILE WATER FOR IRRIGATION IR SOLN
Status: DC | PRN
  Administered 2021-11-13: 1000 mL

## 2021-11-13 MED ORDER — MIDAZOLAM HCL 2 MG/2ML IJ SOLN
INTRAMUSCULAR | Status: AC
  Filled 2021-11-13: qty 2

## 2021-11-13 MED ORDER — MIDAZOLAM HCL 5 MG/5ML IJ SOLN
INTRAMUSCULAR | Status: DC | PRN
Start: 2021-11-13 — End: 2021-11-13
  Administered 2021-11-13: 2 mg via INTRAVENOUS

## 2021-11-13 MED ORDER — CEFAZOLIN SODIUM-DEXTROSE 2-4 GM/100ML-% IV SOLN
2.0000 g | INTRAVENOUS | Status: AC
  Administered 2021-11-13: 2 g via INTRAVENOUS
  Filled 2021-11-13: qty 100

## 2021-11-13 MED ORDER — EPHEDRINE 5 MG/ML INJ
INTRAVENOUS | Status: AC
  Filled 2021-11-13: qty 5

## 2021-11-13 MED ORDER — OXYCODONE HCL 5 MG/5ML PO SOLN: 5.0000 mg | Freq: Once | ORAL | Status: AC | PRN

## 2021-11-13 MED ORDER — ACETAMINOPHEN 500 MG PO TABS
1000.0000 mg | ORAL_TABLET | ORAL | Status: AC
  Administered 2021-11-13: 1000 mg via ORAL
  Filled 2021-11-13: qty 2

## 2021-11-13 MED ORDER — HEPARIN SODIUM (PORCINE) 5000 UNIT/ML IJ SOLN
5000.0000 [IU] | INTRAMUSCULAR | Status: AC
  Administered 2021-11-13: 5000 [IU] via SUBCUTANEOUS
  Filled 2021-11-13: qty 1

## 2021-11-13 MED ORDER — DEXAMETHASONE SODIUM PHOSPHATE 4 MG/ML IJ SOLN: 4.0000 mg | INTRAMUSCULAR | Status: DC

## 2021-11-13 MED ORDER — DEXAMETHASONE SODIUM PHOSPHATE 10 MG/ML IJ SOLN
INTRAMUSCULAR | Status: DC | PRN
  Administered 2021-11-13: 6 mg via INTRAVENOUS

## 2021-11-13 MED ORDER — HYDROMORPHONE HCL 1 MG/ML IJ SOLN
INTRAMUSCULAR | Status: DC | PRN
  Administered 2021-11-13: .5 mg via INTRAVENOUS

## 2021-11-13 MED ORDER — ROCURONIUM BROMIDE 10 MG/ML (PF) SYRINGE
PREFILLED_SYRINGE | INTRAVENOUS | Status: DC | PRN
Start: 2021-11-13 — End: 2021-11-13
  Administered 2021-11-13 (×2): 20 mg via INTRAVENOUS
  Administered 2021-11-13: 50 mg via INTRAVENOUS
  Administered 2021-11-13: 10 mg via INTRAVENOUS

## 2021-11-13 MED ORDER — HYDROMORPHONE HCL 1 MG/ML IJ SOLN: 0.2500 mg | INTRAMUSCULAR | Status: DC | PRN

## 2021-11-13 MED ORDER — LACTATED RINGERS IV SOLN: INTRAVENOUS | Status: DC | PRN

## 2021-11-13 MED ORDER — FENTANYL CITRATE (PF) 100 MCG/2ML IJ SOLN
INTRAMUSCULAR | Status: DC | PRN
  Administered 2021-11-13: 100 ug via INTRAVENOUS

## 2021-11-13 MED ORDER — OXYCODONE HCL 5 MG PO TABS
5.0000 mg | ORAL_TABLET | Freq: Once | ORAL | Status: AC | PRN
  Administered 2021-11-13: 5 mg via ORAL

## 2021-11-13 MED ORDER — HYDROMORPHONE HCL 2 MG/ML IJ SOLN
INTRAMUSCULAR | Status: AC
  Filled 2021-11-13: qty 1

## 2021-11-13 MED ORDER — EPHEDRINE SULFATE-NACL 50-0.9 MG/10ML-% IV SOSY
PREFILLED_SYRINGE | INTRAVENOUS | Status: DC | PRN
  Administered 2021-11-13 (×3): 10 mg via INTRAVENOUS

## 2021-11-13 MED ORDER — FENTANYL CITRATE (PF) 100 MCG/2ML IJ SOLN
INTRAMUSCULAR | Status: AC
  Filled 2021-11-13: qty 2

## 2021-11-13 MED ORDER — ONDANSETRON HCL 4 MG/2ML IJ SOLN
INTRAMUSCULAR | Status: DC | PRN
  Administered 2021-11-13: 4 mg via INTRAVENOUS

## 2021-11-13 MED ORDER — BUPIVACAINE HCL 0.25 % IJ SOLN
INTRAMUSCULAR | Status: DC | PRN
  Administered 2021-11-13: 32 mL

## 2021-11-13 MED ORDER — STERILE WATER FOR INJECTION IJ SOLN
INTRAMUSCULAR | Status: AC
  Filled 2021-11-13: qty 10

## 2021-11-13 MED ORDER — STERILE WATER FOR INJECTION IJ SOLN
INTRAMUSCULAR | Status: DC | PRN
  Administered 2021-11-13: 10 mL

## 2021-11-13 MED ORDER — SUGAMMADEX SODIUM 200 MG/2ML IV SOLN
INTRAVENOUS | Status: DC | PRN
  Administered 2021-11-13: 200 mg via INTRAVENOUS

## 2021-11-13 MED ORDER — LIDOCAINE 2% (20 MG/ML) 5 ML SYRINGE
INTRAMUSCULAR | Status: DC | PRN
Start: 2021-11-13 — End: 2021-11-13
  Administered 2021-11-13: 80 mg via INTRAVENOUS
  Administered 2021-11-13: 1.5 mg/kg/h via INTRAVENOUS

## 2021-11-13 MED ORDER — CHLORHEXIDINE GLUCONATE 0.12 % MT SOLN
15.0000 mL | Freq: Once | OROMUCOSAL | Status: AC
Start: 2021-11-13 — End: 2021-11-13
  Administered 2021-11-13: 15 mL via OROMUCOSAL

## 2021-11-13 MED ORDER — LACTATED RINGERS IV SOLN: INTRAVENOUS | Status: DC

## 2021-11-13 MED ORDER — KETOROLAC TROMETHAMINE 30 MG/ML IJ SOLN: 30.0000 mg | Freq: Once | INTRAMUSCULAR | Status: DC | PRN

## 2021-11-13 MED ORDER — STERILE WATER FOR INJECTION IJ SOLN
INTRAMUSCULAR | Status: DC | PRN
  Administered 2021-11-13: 4 mL

## 2021-11-13 MED ORDER — BUPIVACAINE HCL 0.25 % IJ SOLN
INTRAMUSCULAR | Status: AC
  Filled 2021-11-13: qty 1

## 2021-11-13 MED ORDER — ONDANSETRON HCL 4 MG/2ML IJ SOLN: 4.0000 mg | Freq: Once | INTRAMUSCULAR | Status: DC | PRN

## 2021-11-13 MED ORDER — ENSURE PRE-SURGERY PO LIQD
296.0000 mL | Freq: Once | ORAL | Status: DC
  Filled 2021-11-13: qty 296

## 2021-11-13 MED ORDER — PROPOFOL 10 MG/ML IV BOLUS
INTRAVENOUS | Status: DC | PRN
  Administered 2021-11-13: 90 mg via INTRAVENOUS

## 2021-11-13 MED ORDER — LACTATED RINGERS IR SOLN
Status: DC | PRN
  Administered 2021-11-13: 1000 mL

## 2021-11-13 SURGICAL SUPPLY — 83 items
ADH SKN CLS APL DERMABOND .7 (GAUZE/BANDAGES/DRESSINGS) ×2
AGENT HMST KT MTR STRL THRMB (HEMOSTASIS)
APL ESCP 34 STRL LF DISP (HEMOSTASIS)
APL SRG 38 LTWT LNG FL B (MISCELLANEOUS) ×2
APPLICATOR ARISTA FLEXITIP XL (MISCELLANEOUS) ×2 IMPLANT
APPLICATOR SURGIFLO ENDO (HEMOSTASIS) IMPLANT
BACTOSHIELD CHG 4% 4OZ (MISCELLANEOUS) ×2
BAG COUNTER SPONGE SURGICOUNT (BAG) IMPLANT
BAG LAPAROSCOPIC 12 15 PORT 16 (BASKET) IMPLANT
BAG RETRIEVAL 12/15 (BASKET)
BAG RETRIEVAL 12/15MM (BASKET)
BAG SPEC RTRVL LRG 6X4 10 (ENDOMECHANICALS)
BAG SPNG CNTER NS LX DISP (BAG)
BAG SURGICOUNT SPONGE COUNTING (BAG)
BLADE SURG SZ10 CARB STEEL (BLADE) IMPLANT
CATH FOLEY 2WAY SLVR  5CC 14FR (CATHETERS) ×4
CATH FOLEY 2WAY SLVR 5CC 14FR (CATHETERS) IMPLANT
COVER BACK TABLE 60X90IN (DRAPES) ×4 IMPLANT
COVER TIP SHEARS 8 DVNC (MISCELLANEOUS) ×2 IMPLANT
COVER TIP SHEARS 8MM DA VINCI (MISCELLANEOUS) ×4
DERMABOND ADVANCED (GAUZE/BANDAGES/DRESSINGS) ×2
DERMABOND ADVANCED .7 DNX12 (GAUZE/BANDAGES/DRESSINGS) ×2 IMPLANT
DRAPE ARM DVNC X/XI (DISPOSABLE) ×8 IMPLANT
DRAPE COLUMN DVNC XI (DISPOSABLE) ×2 IMPLANT
DRAPE DA VINCI XI ARM (DISPOSABLE) ×16
DRAPE DA VINCI XI COLUMN (DISPOSABLE) ×4
DRAPE SHEET LG 3/4 BI-LAMINATE (DRAPES) ×4 IMPLANT
DRAPE SURG IRRIG POUCH 19X23 (DRAPES) ×4 IMPLANT
DRSG OPSITE POSTOP 4X6 (GAUZE/BANDAGES/DRESSINGS) IMPLANT
DRSG OPSITE POSTOP 4X8 (GAUZE/BANDAGES/DRESSINGS) IMPLANT
ELECT PENCIL ROCKER SW 15FT (MISCELLANEOUS) IMPLANT
ELECT REM PT RETURN 15FT ADLT (MISCELLANEOUS) ×4 IMPLANT
GAUZE 4X4 16PLY ~~LOC~~+RFID DBL (SPONGE) ×4 IMPLANT
GLOVE SURG ENC MOIS LTX SZ6 (GLOVE) ×16 IMPLANT
GLOVE SURG ENC MOIS LTX SZ6.5 (GLOVE) ×8 IMPLANT
GOWN STRL REUS W/ TWL LRG LVL3 (GOWN DISPOSABLE) ×8 IMPLANT
GOWN STRL REUS W/TWL LRG LVL3 (GOWN DISPOSABLE) ×20
HEMOSTAT ARISTA ABSORB 3G PWDR (HEMOSTASIS) ×2 IMPLANT
HOLDER FOLEY CATH W/STRAP (MISCELLANEOUS) IMPLANT
IRRIG SUCT STRYKERFLOW 2 WTIP (MISCELLANEOUS) ×4
IRRIGATION SUCT STRKRFLW 2 WTP (MISCELLANEOUS) ×2 IMPLANT
KIT PROCEDURE DA VINCI SI (MISCELLANEOUS) ×4
KIT PROCEDURE DVNC SI (MISCELLANEOUS) IMPLANT
KIT TURNOVER KIT A (KITS) IMPLANT
MANIPULATOR ADVINCU DEL 3.0 PL (MISCELLANEOUS) IMPLANT
MANIPULATOR ADVINCU DEL 3.5 PL (MISCELLANEOUS) IMPLANT
MANIPULATOR UTERINE 4.5 ZUMI (MISCELLANEOUS) ×2 IMPLANT
NDL HYPO 21X1.5 SAFETY (NEEDLE) ×2 IMPLANT
NDL SPNL 18GX3.5 QUINCKE PK (NEEDLE) IMPLANT
NEEDLE HYPO 21X1.5 SAFETY (NEEDLE) ×4 IMPLANT
NEEDLE SPNL 18GX3.5 QUINCKE PK (NEEDLE) ×4 IMPLANT
OBTURATOR OPTICAL STANDARD 8MM (TROCAR) ×4
OBTURATOR OPTICAL STND 8 DVNC (TROCAR) ×2
OBTURATOR OPTICALSTD 8 DVNC (TROCAR) ×2 IMPLANT
PACK ROBOT GYN CUSTOM WL (TRAY / TRAY PROCEDURE) ×4 IMPLANT
PAD POSITIONING PINK XL (MISCELLANEOUS) ×4 IMPLANT
PORT ACCESS TROCAR AIRSEAL 12 (TROCAR) ×2 IMPLANT
PORT ACCESS TROCAR AIRSEAL 5M (TROCAR) ×2
POUCH SPECIMEN RETRIEVAL 10MM (ENDOMECHANICALS) IMPLANT
SCRUB CHG 4% DYNA-HEX 4OZ (MISCELLANEOUS) ×2 IMPLANT
SEAL CANN UNIV 5-8 DVNC XI (MISCELLANEOUS) ×8 IMPLANT
SEAL XI 5MM-8MM UNIVERSAL (MISCELLANEOUS) ×16
SET TRI-LUMEN FLTR TB AIRSEAL (TUBING) ×4 IMPLANT
SPIKE FLUID TRANSFER (MISCELLANEOUS) ×6 IMPLANT
SPONGE T-LAP 18X18 ~~LOC~~+RFID (SPONGE) IMPLANT
SURGIFLO W/THROMBIN 8M KIT (HEMOSTASIS) IMPLANT
SUT MNCRL AB 4-0 PS2 18 (SUTURE) ×2 IMPLANT
SUT PDS AB 1 TP1 96 (SUTURE) IMPLANT
SUT VIC AB 0 CT1 27 (SUTURE) ×4
SUT VIC AB 0 CT1 27XBRD ANTBC (SUTURE) IMPLANT
SUT VIC AB 2-0 CT1 27 (SUTURE)
SUT VIC AB 2-0 CT1 TAPERPNT 27 (SUTURE) IMPLANT
SUT VIC AB 2-0 SH 27 (SUTURE) ×4
SUT VIC AB 2-0 SH 27X BRD (SUTURE) IMPLANT
SUT VIC AB 4-0 PS2 18 (SUTURE) ×8 IMPLANT
SYR 10ML LL (SYRINGE) ×2 IMPLANT
TOWEL OR NON WOVEN STRL DISP B (DISPOSABLE) IMPLANT
TRAP SPECIMEN MUCUS 40CC (MISCELLANEOUS) IMPLANT
TRAY FOLEY MTR SLVR 16FR STAT (SET/KITS/TRAYS/PACK) ×4 IMPLANT
TROCAR XCEL NON-BLD 5MMX100MML (ENDOMECHANICALS) IMPLANT
UNDERPAD 30X36 HEAVY ABSORB (UNDERPADS AND DIAPERS) ×8 IMPLANT
WATER STERILE IRR 1000ML POUR (IV SOLUTION) ×4 IMPLANT
YANKAUER SUCT BULB TIP 10FT TU (MISCELLANEOUS) IMPLANT

## 2021-11-13 NOTE — Op Note (Signed)
OPERATIVE NOTE ? ?Pre-operative Diagnosis: endometrial cancer grade 2 ? ?Post-operative Diagnosis: same ? ?Operation: Robotic-assisted laparoscopic total hysterectomy with bilateral salpingo-oophorectomy, SLN biopsy, repair of posterior vaginal laceration  ? ?Surgeon: Jeral Pinch MD ? ?Assistant Surgeon: Lahoma Crocker MD (an MD assistant was necessary for tissue manipulation, management of robotic instrumentation, retraction and positioning due to the complexity of the case and hospital policies).  ? ?Anesthesia: GET ? ?Urine Output: 250cc ? ?Operative Findings:  On EUA, small mobile uterus. On intra-abdominal entry, normal upper abdominal survey. Normal omentum, small and large bowel. Uterus 6-8 cm, normal in appearance. Normal appearing bilateral adnexa. No obvious adenopathy; mapping successful to bilateral obturator SLNs. No pelvic or abdominal evidence of disease.  ? ?Estimated Blood Loss:  less than 100 mL     ? ?Total IV Fluids: see I&O flowsheet ?        ?Specimens: uterus, cervix, bilateral tubes and ovaries, two right and one left obturator SLNs ?        ?Complications:  None apparent; patient tolerated the procedure well. ?        ?Disposition: PACU - hemodynamically stable. ? ?Procedure Details  ?The patient was seen in the Holding Room. The risks, benefits, complications, treatment options, and expected outcomes were discussed with the patient.  The patient concurred with the proposed plan, giving informed consent.  The site of surgery properly noted/marked. The patient was identified as Catherine Peterson and the procedure verified as a Robotic-assisted hysterectomy with bilateral salpingo oophorectomy with SLN biopsy.  ? ?After induction of anesthesia, the patient was draped and prepped in the usual sterile manner. Patient was placed in supine position after anesthesia and draped and prepped in the usual sterile manner as follows: Her arms were tucked to her side with all appropriate  precautions.  The shoulders were stabilized with padded shoulder blocks applied to the acromium processes.  The patient was placed in the semi-lithotomy position in [place].  The perineum and vagina were prepped with CholoraPrep. The patient was draped after the CholoraPrep had been allowed to dry for 3 minutes.  A Time Out was held and the above information confirmed. ? ?The urethra was prepped with Betadine. Foley catheter was placed.  A sterile speculum was placed in the vagina.  The cervix was grasped with a single-tooth tenaculum. '2mg'$  total of ICG was injected into the cervical stroma at 2 and 9 o'clock with 1cc injected at a 1cm and 80m depth (concentration 0.'5mg'$ /ml) in all locations. The cervix was dilated with PKennon Roundsdilators.  The ZUMI uterine manipulator with a medium colpotomizer ring was placed without difficulty.  A pneum occluder balloon was placed over the manipulator.  OG tube placement was confirmed and to suction.  ? ?Next, a 10 mm skin incision was made 1 cm below the subcostal margin in the midclavicular line.  The 5 mm Optiview port and scope was used for direct entry.  Opening pressure was under 10 mm CO2.  The abdomen was insufflated and the findings were noted as above.   At this point and all points during the procedure, the patient's intra-abdominal pressure did not exceed 15 mmHg. Next, an 8 mm skin incision was made superior to the umbilicus and a right and left port were placed about 8 cm lateral to the robot port on the right and left side.  A fourth arm was placed on the right.  The 5 mm assist trocar was exchanged for a 10-12 mm port. All ports  were placed under direct visualization.  The patient was placed in steep Trendelenburg.  Bowel was folded away into the upper abdomen.  The robot was docked in the normal manner. ? ?The right and left peritoneum were opened parallel to the IP ligament to open the retroperitoneal spaces bilaterally. The round ligaments were transected. The  SLN mapping was performed in bilateral pelvic basins. After identifying the ureters, the para rectal and paravesical spaces were opened up entirely with careful dissection below the level of the ureters bilaterally and to the depth of the uterine artery origin in order to skeletonize the uterine "web" and ensure visualization of all parametrial channels. The para-aortic basins were carefully exposed and evaluated for isolated para-aortic SLN's. Lymphatic channels were identified travelling to the following visualized sentinel lymph node's: two right obturator and one left obturator. These SLN's were separated from their surrounding lymphatic tissue, removed and sent for permanent pathology. ? ?The hysterectomy was started.  The ureter was again noted to be on the medial leaf of the broad ligament.  The peritoneum above the ureter was incised and stretched and the infundibulopelvic ligament was skeletonized, cauterized and cut.  The posterior peritoneum was taken down to the level of the KOH ring.  The anterior peritoneum was also taken down.  The bladder flap was created to the level of the KOH ring.  The uterine artery on the right side was skeletonized, cauterized and cut in the normal manner.  A similar procedure was performed on the left.  The colpotomy was made and the uterus, cervix, bilateral ovaries and tubes were amputated and delivered through the vagina.  Pedicles were inspected and some oozing was noted along the right aspect of the cuff. A figure of eight stitch was used to achieve hemostasis. Oozing was also noted just superior to the ureter along the right broad ligament, The ureter was dissected inferiorly so that this vessel could be grasped and cauterized. Then excellent hemostasis was achieved.  Because of many oozing surfaces during the surgery, Arista was placed along the right pelvic sidewall and the surgical bed in the pelvis.  ? ?The colpotomy at the vaginal cuff was closed with Vicryl on a  CT1 needle in a running manner.  Irrigation was used and excellent hemostasis was achieved.  At this point in the procedure was completed.  Robotic instruments were removed under direct visulaization.  The robot was undocked. The fascia at the 10-12 mm port was closed with 0 Vicryl on a UR-5 needle.  The subcuticular tissue was closed with 4-0 Vicryl and the skin was closed with 4-0 Monocryl in a subcuticular manner.  Dermabond was applied.   ? ?The vagina was swabbed with some bleeding noted from posterior laceration. This was repaired with running 2-0 Vicryl. Vagina was swabbed again with minimal bleeding noted.   All sponge, lap and needle counts were correct x  3. Foley catheter was removed. ? ?The patient was transferred to the recovery room in stable condition. ? ?Jeral Pinch, MD ? ?

## 2021-11-13 NOTE — Anesthesia Postprocedure Evaluation (Signed)
Anesthesia Post Note ? ?Patient: Catherine Peterson ? ?Procedure(s) Performed: XI ROBOTIC ASSISTED TOTAL HYSTERECTOMY WITH BILATERAL SALPINGO,OOPHORECTOMY (Bilateral) ?SENTINEL NODE BIOPSY ? ?  ? ?Patient location during evaluation: PACU ?Anesthesia Type: General ?Level of consciousness: awake and alert ?Pain management: pain level controlled ?Vital Signs Assessment: post-procedure vital signs reviewed and stable ?Respiratory status: spontaneous breathing, nonlabored ventilation, respiratory function stable and patient connected to nasal cannula oxygen ?Cardiovascular status: blood pressure returned to baseline and stable ?Postop Assessment: no apparent nausea or vomiting ?Anesthetic complications: no ? ? ?No notable events documented. ? ?Last Vitals:  ?Vitals:  ? 11/13/21 1430 11/13/21 1445  ?BP: 130/61 (!) 149/80  ?Pulse: 74 70  ?Resp: 17 16  ?Temp: 36.4 ?C (!) 36.4 ?C  ?SpO2: 99% 100%  ?  ?Last Pain:  ?Vitals:  ? 11/13/21 1445  ?TempSrc:   ?PainSc: 5   ? ? ?  ?  ?  ?  ?  ?  ? ?[place]et [place] ? ? ? ? ?

## 2021-11-13 NOTE — Transfer of Care (Signed)
Immediate Anesthesia Transfer of Care Note ? ?Patient: Catherine Peterson ? ?Procedure(s) Performed: Procedure(s): ?XI ROBOTIC ASSISTED TOTAL HYSTERECTOMY WITH BILATERAL SALPINGO,OOPHORECTOMY (Bilateral) ?SENTINEL NODE BIOPSY (N/A) ? ?Patient Location: PACU ? ?Anesthesia Type:General ? ?Level of Consciousness:  sedated, patient cooperative and responds to stimulation ? ?Airway & Oxygen Therapy:Patient Spontanous Breathing and Patient connected to face mask oxgen ? ?Post-op Assessment:  Report given to PACU RN and Post -op Vital signs reviewed and stable ? ?Post vital signs:  Reviewed and stable ? ?Last Vitals:  ?Vitals:  ? 11/13/21 0917  ?BP: (!) 144/68  ?Pulse: 66  ?Resp: 15  ?Temp: 36.6 ?C  ?SpO2: 100%  ? ? ?Complications: No apparent anesthesia complications ? ?

## 2021-11-13 NOTE — Discharge Instructions (Signed)

## 2021-11-13 NOTE — Interval H&P Note (Signed)
History and Physical Interval Note: ? ?11/13/2021 ?9:48 AM ? ?Catherine Peterson  has presented today for surgery, with the diagnosis of ENDOMETRIAL CANCER.  The various methods of treatment have been discussed with the patient and family. After consideration of risks, benefits and other options for treatment, the patient has consented to  Procedure(s): ?XI ROBOTIC ASSISTED TOTAL HYSTERECTOMY WITH BILATERAL SALPINGO,OOPHORECTOMY,POSSIBLE LAPAROTOMY (Bilateral) ?SENTINEL NODE BIOPSY (N/A) ?POSSIBLE LYMPH NODE DISSECTION (N/A) as a surgical intervention.  The patient's history has been reviewed, patient examined, no change in status, stable for surgery.  I have reviewed the patient's chart and labs.  Questions were answered to the patient's satisfaction.   ? ? ?[name] ? ? ?

## 2021-11-13 NOTE — Anesthesia Procedure Notes (Signed)
Procedure Name: Intubation ?Date/Time: 11/13/2021 11:24 AM ?Performed by: Lavina Hamman, CRNA ?Pre-anesthesia Checklist: Patient identified, Emergency Drugs available, Suction available, Patient being monitored and Timeout performed ?Patient Re-evaluated:Patient Re-evaluated prior to induction ?Oxygen Delivery Method: Circle system utilized ?Preoxygenation: Pre-oxygenation with 100% oxygen ?Induction Type: IV induction ?Ventilation: Mask ventilation without difficulty ?Laryngoscope Size: Mac and 3 ?Grade View: Grade I ?Tube type: Oral ?Tube size: 7.5 mm ?Number of attempts: 1 ?Airway Equipment and Method: Stylet ?Placement Confirmation: ETT inserted through vocal cords under direct vision, positive ETCO2, CO2 detector and breath sounds checked- equal and bilateral ?Secured at: 21 cm ?Tube secured with: Tape ?Dental Injury: Teeth and Oropharynx as per pre-operative assessment  ?Comments: ATOI ? ? ? ? ?

## 2021-11-14 ENCOUNTER — Encounter (HOSPITAL_COMMUNITY): Payer: Self-pay | Admitting: Gynecologic Oncology

## 2021-11-14 ENCOUNTER — Telehealth: Payer: Self-pay | Admitting: *Deleted

## 2021-11-14 NOTE — Telephone Encounter (Signed)
Spoke with Catherine Peterson this morning. She states she is eating, drinking and urinating well. She has had a BM yet and is passing gas. She is taking senokot as prescribed and encouraged her to drink plenty of water. She denies fever or chills. Incisions are dry and intact. She rates her pain 4/10. Her pain is controlled with ibuprofen and Tylenol   ? ?Instructed to call office with any fever, chills, purulent drainage, uncontrolled pain or any other questions or concerns. Patient verbalizes understanding.  ? ?Pt aware of post op appointments as well as the office number [phone] and after hours number [phone] to call if she has any questions or concerns  ? ?Reminded pt of phone visit with Dr.Tucker on 3/15 @ 4pm and an in person visit on 3/30 @ 3:30pm  ?

## 2021-11-15 ENCOUNTER — Other Ambulatory Visit: Payer: Self-pay

## 2021-11-15 ENCOUNTER — Ambulatory Visit (HOSPITAL_COMMUNITY)
Admission: RE | Admit: 2021-11-15 | Discharge: 2021-11-15 | Disposition: A | Discharge status: Home / Self Care | Payer: Medicare PPO | Source: Ambulatory Visit | Attending: Gynecologic Oncology | Admitting: Gynecologic Oncology

## 2021-11-15 ENCOUNTER — Other Ambulatory Visit: Payer: Self-pay | Admitting: Gynecologic Oncology

## 2021-11-15 ENCOUNTER — Telehealth: Payer: Self-pay

## 2021-11-15 DIAGNOSIS — R600 Localized edema: Secondary | ICD-10-CM

## 2021-11-15 NOTE — Telephone Encounter (Signed)
Following up with Catherine Peterson. Advised patient that a doppler study has been ordered just to be sure there are no blood clots. Appointment has been scheduled at [facility] today at 2 pm. Patient verbalized understanding and is in agreement of appointment.  ?

## 2021-11-15 NOTE — Telephone Encounter (Signed)
Received call from [facility] in Vascular ultrasound. Catherine Peterson was negative for DVT. Catherine John, NP notified.  ?

## 2021-11-15 NOTE — Progress Notes (Signed)
See RN note. Patient reporting BLE edema, more in the left with a slight buldge in left leg. Doppler ordered to rule out DVT with chance of DVT being lower but post-op. ?

## 2021-11-15 NOTE — Telephone Encounter (Signed)
Following up with Ms. Erven this morning regarding her triage call last night re: leg swelling.  ?Patient reports she noticed bilateral leg swelling (calf and thighs) yesterday afternoon. She had company visiting and was sitting in a char with her feet down for several hours. After her company left she noticed the swelling. "The swelling is in both legs but a little more so in the left leg. On my left leg three inches below the knee on the side there is a slight bulging." Patient denies pain but reports soreness in her thigh muscles as if she's been doing squats. Denies difficulty or pain with walking. Denies redness or warmth.  ?She reports taking HCTZ for an underactive thyroid. She has had ankle and calf swelling in the past but has not had thigh swelling.  ? ?"I'm doing very well today. Swelling has gone down some this morning but is still a little swollen. I have been up and moving, drinking plenty of fluids and took an additional water pill. I am urinating like normal today. I was concerned and called for reassurance. The day of surgery I weighed 116 lb and yesterday I weighed 119 from the fluid. This morning I weighed 118.4. I just want to be sure I'm doing everything right and see if theres anything else I can do." ?Advised patient that the provider will be notified and someone from the office will call and follow up with her today.  ?

## 2021-11-15 NOTE — Progress Notes (Signed)
Bilateral lower extremity venous duplex has been completed. ?Preliminary results can be found in CV Proc through chart review.  ?Results were given to Cira Rue NP. ? ?11/15/21 2:08 PM ?Carlos Levering RVT   ?

## 2021-11-16 ENCOUNTER — Other Ambulatory Visit: Payer: Self-pay

## 2021-11-19 ENCOUNTER — Encounter: Payer: Self-pay | Admitting: Gynecologic Oncology

## 2021-11-19 ENCOUNTER — Ambulatory Visit (HOSPITAL_BASED_OUTPATIENT_CLINIC_OR_DEPARTMENT_OTHER): Payer: Medicare PPO | Admitting: Gynecologic Oncology

## 2021-11-19 DIAGNOSIS — Z90722 Acquired absence of ovaries, bilateral: Secondary | ICD-10-CM

## 2021-11-19 DIAGNOSIS — Z7189 Other specified counseling: Secondary | ICD-10-CM

## 2021-11-19 DIAGNOSIS — C541 Malignant neoplasm of endometrium: Secondary | ICD-10-CM

## 2021-11-19 DIAGNOSIS — Z90710 Acquired absence of both cervix and uterus: Secondary | ICD-10-CM

## 2021-11-19 NOTE — Progress Notes (Unsigned)
Gynecologic Oncology Telehealth Consult Note: Gyn-Onc  I connected with Catherine Peterson on 11/19/21 at  4:45 PM EDT by telephone and verified that I am speaking with the correct person using two identifiers.  I discussed the limitations, risks, security and privacy concerns of performing an evaluation and management service by telemedicine and the availability of in-person appointments. I also discussed with the patient that there may be a patient responsible charge related to this service. The patient expressed understanding and agreed to proceed.  Other persons participating in the visit and their role in the encounter: none.  Patient's location: home Provider's location: [facility]  Reason for Visit: follow-up after surgery  Treatment History: Oncology History   No history exists.    Interval History: ***  Past Medical/Surgical History: Past Medical History:  Diagnosis Date   ADHD (attention deficit hyperactivity disorder)    Anxiety    Arthritis    in thumbs. more in rt thumb   Basal cell carcinoma    Leg and back of R arm    Depression    Hyperlipidemia    Hypokalemia    Hypothyroidism    Lower leg edema    PMB (postmenopausal bleeding) 01/2002   endo polyp   Squamous cell skin cancer    Thyroid disease 2002   hypothyroidism    Past Surgical History:  Procedure Laterality Date   CHOLECYSTECTOMY, LAPAROSCOPIC  06/07/2011   HAMMERTOE RECONSTRUCTION WITH WEIL OSTEOTOMY Right 09/25/2017   Procedure: Right 2-4 metatarsal Weil osteotomies and hammertoe corrections;  Surgeon: Wylene Simmer, MD;  Location: [facility];  Service: Orthopedics;  Laterality: Right;   HAND SURGERY Right    HYSTEROSCOPY  01/07/2002   endo polyp and focal simple hyperplasia   LEFT THUMB SURGERY      POPLITEAL SYNOVIAL CYST EXCISION Left 2008 and 2010   ROBOTIC ASSISTED TOTAL HYSTERECTOMY WITH BILATERAL SALPINGO OOPHERECTOMY Bilateral 11/13/2021   Procedure: XI ROBOTIC ASSISTED  TOTAL HYSTERECTOMY WITH BILATERAL SALPINGO,OOPHORECTOMY;  Surgeon: [name], MD;  Location: [facility];  Service: Gynecology;  Laterality: Bilateral;   SENTINEL NODE BIOPSY N/A 11/13/2021   Procedure: SENTINEL NODE BIOPSY;  Surgeon: [name], MD;  Location: [facility];  Service: Gynecology;  Laterality: N/A;   TONSILLECTOMY      Family History  Problem Relation Age of Onset   Cancer Mother        multiple myeloma   Cancer Father        oral   Diabetes Father    Diabetes Brother    Diabetes Brother    Heart disease Brother 35       secondary to drug use   Uterine cancer Maternal Grandmother    Diabetes Paternal Grandmother    Osteoporosis Other    Colon cancer Neg Hx     Social History   Socioeconomic History   Marital status: Widowed    Spouse name: Not on file   Number of children: 2   Years of education: Not on file   Highest education level: Not on file  Occupational History   Occupation: retired  Tobacco Use   Smoking status: Never   Smokeless tobacco: Never  Vaping Use   Vaping Use: Never used  Substance and Sexual Activity   Alcohol use: No   Drug use: No   Sexual activity: Not Currently    Partners: Male    Birth control/protection: Post-menopausal  Other Topics Concern   Not on file  Social History Narrative  Husband died 2008. Not working outside the home.   Social Determinants of Health   Financial Resource Strain: Not on file  Food Insecurity: Not on file  Transportation Needs: Not on file  Physical Activity: Not on file  Stress: Not on file  Social Connections: Not on file    Current Medications:  Current Outpatient Medications:    ACETAMINOPHEN-BUTALBITAL 50-325 MG TABS, Take 1 tablet by mouth daily as needed (tension headache)., Disp: , Rfl:    amphetamine-dextroamphetamine (ADDERALL XR) 30 MG 24 hr capsule, Take 30 mg by mouth every morning., Disp: , Rfl:    ASPERCREME LIDOCAINE EX, Apply 1 application topically daily as  needed (pain)., Disp: , Rfl:    aspirin EC 81 MG tablet, Take 81 mg by mouth every morning. Swallow whole., Disp: , Rfl:    b complex vitamins capsule, Take 1 capsule by mouth daily. (Patient not taking: Reported on 11/06/2021), Disp: , Rfl:    BIOTIN PO, Take 1 capsule by mouth at bedtime. (Patient not taking: Reported on 11/06/2021), Disp: , Rfl:    bisacodyl (DULCOLAX) 5 MG EC tablet, Take 5 mg by mouth daily as needed (constipation)., Disp: , Rfl:    buPROPion (WELLBUTRIN XL) 300 MG 24 hr tablet, Take 300 mg by mouth every morning., Disp: , Rfl:    cholecalciferol (VITAMIN D) 1000 UNITS tablet, Take 2,000 Units by mouth at bedtime. (Patient not taking: Reported on 11/06/2021), Disp: , Rfl:    diclofenac Sodium (VOLTAREN) 1 % GEL, Apply 1 application topically daily as needed (pain)., Disp: , Rfl:    folic acid (FOLVITE) 500 MCG tablet, Take 400 mcg by mouth daily.   (Patient not taking: Reported on 11/06/2021), Disp: , Rfl:    Ginkgo Biloba 120 MG CAPS, Take 120 mg by mouth at bedtime. (Patient not taking: Reported on 11/06/2021), Disp: , Rfl:    hydrochlorothiazide 25 MG tablet, Take 25 mg by mouth every morning. Per patient for fluid retention not HTN, Disp: , Rfl:    Ibuprofen 200 MG CAPS, Take 400 mg by mouth 2 (two) times daily as needed (pain)., Disp: , Rfl:    Melatonin 10 MG TABS, Take 10 mg by mouth at bedtime., Disp: , Rfl:    Multiple Vitamin (MULTIVITAMIN WITH MINERALS) TABS tablet, Take 1 tablet by mouth at bedtime. (Patient not taking: Reported on 11/06/2021), Disp: , Rfl:    Polyvinyl Alcohol (LIQUID TEARS OP), Place 1 drop into both eyes daily as needed (dry eyes)., Disp: , Rfl:    Probiotic Product (PROBIOTIC PO), Take 1 tablet by mouth at bedtime. (Patient not taking: Reported on 11/06/2021), Disp: , Rfl:    senna-docusate (SENOKOT-S) 8.6-50 MG tablet, Take 2 tablets by mouth at bedtime. For AFTER surgery, do not take if having diarrhea (Patient not taking: Reported on 11/06/2021),  Disp: 30 tablet, Rfl: 0   thyroid (ARMOUR) 60 MG tablet, Take 60 mg by mouth daily before breakfast., Disp: , Rfl:    traMADol (ULTRAM) 50 MG tablet, Take 1 tablet (50 mg total) by mouth every 6 (six) hours as needed for severe pain. For AFTER surgery only, do not take and drive (Patient not taking: Reported on 11/06/2021), Disp: 10 tablet, Rfl: 0   trolamine salicylate (ASPERCREME) 10 % cream, Apply 1 application topically daily as needed for muscle pain., Disp: , Rfl:    Turmeric 500 MG CAPS, Take 500 mg by mouth at bedtime. (Patient not taking: Reported on 11/06/2021), Disp: , Rfl:    vitamin  k 100 MCG tablet, Take 100 mcg by mouth at bedtime. (Patient not taking: Reported on 11/06/2021), Disp: , Rfl:   Review of Symptoms: Complete 10-system review is negative except ***as above in Interval History.  Physical Exam: LMP 09/10/2003  General: ***Alert, oriented, no acute distress. HEENT: ***Posterior oropharynx clear, sclera anicteric. Chest: ***Clear to auscultation bilaterally.  ***Port site clean. Cardiovascular: ***Regular rate and rhythm, no murmurs. Abdomen: ***Obese, soft, nontender.  Normoactive bowel sounds.  No masses or hepatosplenomegaly appreciated.  ***Well-healed scar. Extremities: ***Grossly normal range of motion.  Warm, well perfused.  No edema bilaterally. Skin: ***No rashes or lesions noted. Lymphatics: ***No cervical, supraclavicular, or inguinal adenopathy. GU: Normal appearing external genitalia without erythema, excoriation, or lesions.  Speculum exam reveals ***.  Bimanual exam reveals ***.  ***Rectovaginal exam  confirms ___.  Laboratory & Radiologic Studies: A. LYMPH NODE, SENTINEL, RIGHT OBTURATOR, EXCISION:  - No carcinoma identified in 1 lymph node (0/1).   B. LYMPH NODE, SENTINEL, RIGHT OBTURATOR #2, EXCISION:  - No carcinoma identified in 1 lymph node (0/1).   C. LYMPH NODE, SENTINEL, LEFT OBTURATOR, EXCISION:  - No carcinoma identified in 1 lymph node  (0/1).   D. 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  - Tumor invades less than half of of myometrial thickness.  - Cervix: Unremarkable.  No atypia, dysplasia, or malignancy.  Endometrial carcinoma does not involve cervix.  - Bilateral fallopian tubes: Unremarkable segments of fimbriated  fallopian tube.  No malignancy.  - Right ovary: Benign cyst.  No malignancy.  - Left ovary: Benign cysts.  No malignancy.   ONCOLOGY TABLE:   UTERUS, CARCINOMA OR CARCINOSARCOMA: Resection   Procedure: Total hysterectomy and bilateral salpingo-oophorectomy  Histologic Type: Endometrioid adenocarcinoma  Histologic Grade: 2  Myometrial Invasion:       Depth of Myometrial Invasion (mm): 1 mm       Myometrial Thickness (mm): 11 mm       Percentage of Myometrial Invasion: 9%  Uterine Serosa Involvement: Not identified  Cervical stromal Involvement: Not identified  Extent of involvement of other tissue/organs: Not identified  Peritoneal/Ascitic Fluid: Not applicable  Lymphovascular Invasion: Not identified  Regional Lymph Nodes: Present.  No carcinoma identified in any of 3  lymph nodes (0/3).       Pelvic Lymph Nodes Examined:                                   Sentinel: 3                                   Non-sentinel: 0                                   Total :3       Pelvic Lymph Nodes with Metastasis: 0                           Macrometastasis: (>2.0 mm): 0                          Micrometastasis: (>0.2 mm and < 2.0 mm):  0                            Isolated Tumor Cells (<0.2 mm): 0                       Laterality of Lymph Node with Tumor: Not applicable                             Extracapsular Extension: Not applicable       Para-aortic Lymph Nodes Examined: 0  Distant Metastasis:       Distant Site(s) Involved: Not applicable  Pathologic  Stage Classification (pTNM, AJCC 8th Edition): pT1a pN 0  Ancillary Studies: MMR / MSI testing will be ordered  Representative Tumor Block: D6  Comment(s):  Pancytokeratin was performed on the lymph nodes and is  negative.   Assessment & Plan: Catherine Peterson is a 71 y.o. woman with Stage *** who presents for ***.  ***  I discussed the assessment and treatment plan with the patient. The patient was provided with an opportunity to ask questions and all were answered. The patient agreed with the plan and demonstrated an understanding of the instructions.   The patient was advised to call back or see an in-person evaluation if the symptoms worsen or if the condition fails to improve as anticipated.   *** minutes of total time was spent for this patient encounter, including preparation, phone counseling with the patient and coordination of care, and documentation of the encounter.   Jeral Pinch, MD  Division of Gynecologic Oncology  Department of Obstetrics and Gynecology  [facility]

## 2021-11-20 ENCOUNTER — Encounter: Payer: Self-pay | Admitting: Oncology

## 2021-11-20 DIAGNOSIS — C541 Malignant neoplasm of endometrium: Secondary | ICD-10-CM

## 2021-11-20 NOTE — Progress Notes (Signed)
Referral placed for patient to see Dr. Sondra Come in Radiation Oncology in 2-3 weeks. ?

## 2021-11-21 ENCOUNTER — Inpatient Hospital Stay: Payer: Medicare PPO | Admitting: Gynecologic Oncology

## 2021-11-22 LAB — SURGICAL PATHOLOGY

## 2021-11-23 ENCOUNTER — Telehealth: Payer: Self-pay | Admitting: *Deleted

## 2021-11-23 NOTE — Telephone Encounter (Signed)
Spoke with Ms.Fleener this afternoon and informed her that per Lenna Sciara, NP This is a lab that we check on the cancer itself to see if there are any genetic nutations that may have contributed to the development of this cancer.  The IHC results were normal.  There will be one other test that they run and if this is normal as well then we can say that this cancer was sporadic and not genetically driven. Catherine Peterson verbalized understanding and did not voice any other concerns.  ?

## 2021-11-23 NOTE — Telephone Encounter (Signed)
Pt called in this afternoon with some questions regarding her molecular pathology results. She was wondering what does a normal IHC mean? Does it mean that it's normal from the surgery she had performed or is it normal as a genetic marker? Informed her we would let the provider know and reach back out to her. Pt verbalized understanding and did not voice any other concerns.  ?

## 2021-11-28 ENCOUNTER — Encounter: Payer: Self-pay | Admitting: Gynecologic Oncology

## 2021-12-03 ENCOUNTER — Other Ambulatory Visit: Payer: Self-pay | Admitting: Oncology

## 2021-12-03 NOTE — Progress Notes (Signed)
Gynecologic Oncology Multi-Disciplinary Disposition Conference Note ? ?Date of the Conference: 12/03/2021 ? ?Patient Name: Catherine Peterson [facility]  ?Referring Provider: Dr. Talbert Nan ?Primary GYN Oncologist: Dr. Berline Lopes ? ?Stage/Disposition:  Stage IA, grade Disposition is to adjuvant vaginal brachytherapy.  ? ?This Multidisciplinary conference took place involving physicians from [place], Medical Oncology, Radiation Oncology, Pathology, Radiology along with the Gynecologic Oncology Nurse Practitioner and Gynecologic Oncology Nurse Navigator.  Comprehensive assessment of the patient's malignancy, staging, need for surgery, chemotherapy, radiation therapy, and need for further testing were reviewed. Supportive measures, both inpatient and following discharge were also discussed. The recommended plan of care is documented. Greater than 35 minutes were spent correlating and coordinating this patient's care.  ?

## 2021-12-06 ENCOUNTER — Inpatient Hospital Stay: Payer: Medicare PPO | Attending: Gynecologic Oncology | Admitting: Gynecologic Oncology

## 2021-12-06 ENCOUNTER — Encounter: Payer: Self-pay | Admitting: Gynecologic Oncology

## 2021-12-06 ENCOUNTER — Other Ambulatory Visit: Payer: Self-pay

## 2021-12-06 VITALS — BP 134/61 | HR 93 | Temp 98.1°F | Resp 16 | Ht 61.42 in | Wt 117.7 lb

## 2021-12-06 DIAGNOSIS — Z7189 Other specified counseling: Secondary | ICD-10-CM

## 2021-12-06 DIAGNOSIS — N952 Postmenopausal atrophic vaginitis: Secondary | ICD-10-CM

## 2021-12-06 DIAGNOSIS — C541 Malignant neoplasm of endometrium: Secondary | ICD-10-CM

## 2021-12-06 DIAGNOSIS — Z90710 Acquired absence of both cervix and uterus: Secondary | ICD-10-CM

## 2021-12-06 DIAGNOSIS — Z90722 Acquired absence of ovaries, bilateral: Secondary | ICD-10-CM

## 2021-12-06 MED ORDER — ESTRADIOL 0.1 MG/GM VA CREA: 1.0000 | TOPICAL_CREAM | VAGINAL | 2 refills | Status: DC

## 2021-12-06 NOTE — Patient Instructions (Addendum)
It was good to see you today.  You are healing well from surgery although a little bit slower than normal.  For this reason, I am sending in vaginal estrogen that I would like you to use 3 times a week at night until I see you back in about a month. ? ?I will then plan to see you after you finish vaginal radiation.  You will see Dr. Isidore Moos for follow-up a month after you finish treatment and then see me 3 months after.  Please call me to get that scheduled once you have completed treatment. ? ?Remember, no heavy lifting until 6 weeks after treatment and nothing in the vagina for 8 weeks. ?

## 2021-12-06 NOTE — Progress Notes (Addendum)
Gynecologic Oncology Return Clinic Visit ? ?11/09/2021 ? ?Reason for Visit: Postoperative follow-up, treatment review ? ?Treatment History: ?Oncology History Overview Note  ?MMR IHC intact ?MS stable ?  ?Endometrial cancer ([place])  ?10/29/2021 Initial Biopsy  ? 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 ?  ?11/05/2021 Initial Diagnosis  ? Endometrial cancer [facility]) ?  ?11/09/2021 Imaging  ? CT A/P: ?1. Patient's known uterine/cervical cancer is poorly evaluated on ?CT. No evidence of metastatic disease within the abdomen or pelvis. ?2. Homogeneous low-density 15 mm right ovarian cyst. No follow-up ?imaging recommended. Note: This recommendation does not apply to ?premenarchal patients and to those with increased risk (genetic, ?family history, elevated tumor markers or other high-risk factors) ?of ovarian cancer. Reference: JACR 2020 Feb; 17(2):248-254 ?3.  Aortic Atherosclerosis (ICD10-I70.0). ?  ?11/13/2021 Surgery  ? TRH/BSO, SLN biopsy, repair of posterior vaginal laceration ? ?Findings:  On EUA, small mobile uterus. On intra-abdominal entry, normal upper abdominal survey. Normal omentum, small and large bowel. Uterus 6-8 cm, normal in appearance. Normal appearing bilateral adnexa. No obvious adenopathy; mapping successful to bilateral obturator SLNs. No pelvic or abdominal evidence of disease.  ?  ?11/13/2021 Pathology Results  ? A. LYMPH NODE, SENTINEL, RIGHT OBTURATOR, EXCISION:  ?- No carcinoma identified in 1 lymph node (0/1).  ? ?B. LYMPH NODE, SENTINEL, RIGHT OBTURATOR #2, EXCISION:  ?- No carcinoma identified in 1 lymph node (0/1).  ? ?C. LYMPH NODE, SENTINEL, LEFT OBTURATOR, EXCISION:  ?- No carcinoma identified in 1 lymph node (0/1).  ? ?D. UTERUS, CERVIX, BILATERAL FALLOPIAN TUBES AND OVARIES:  ?- Endometrioid adenocarcinoma, FIGO grade 2, with 0.1 cm maximal  ?invasion into myometrium (where myometrial thickness is 1.1 cm).  ?- Tumor invades less than half of of myometrial thickness.  ?- Cervix:  Unremarkable.  No atypia, dysplasia, or malignancy.  ?Endometrial carcinoma does not involve cervix.  ?- Bilateral fallopian tubes: Unremarkable segments of fimbriated  ?fallopian tube.  No malignancy.  ?- Right ovary: Benign cyst.  No malignancy.  ?- Left ovary: Benign cysts.  No malignancy.  ? ?ONCOLOGY TABLE:  ? ?UTERUS, CARCINOMA OR CARCINOSARCOMA: Resection  ? ?Procedure: Total hysterectomy and bilateral salpingo-oophorectomy  ?Histologic Type: Endometrioid adenocarcinoma  ?Histologic Grade: 2  ?Myometrial Invasion:  ?     Depth of Myometrial Invasion (mm): 1 mm  ?     Myometrial Thickness (mm): 11 mm  ?     Percentage of Myometrial Invasion: 9%  ?Uterine Serosa Involvement: Not identified  ?Cervical stromal Involvement: Not identified  ?Extent of involvement of other tissue/organs: Not identified  ?Peritoneal/Ascitic Fluid: Not applicable  ?Lymphovascular Invasion: Not identified  ?Regional Lymph Nodes: Present.  No carcinoma identified in any of 3  ?lymph nodes (0/3).  ?     Pelvic Lymph Nodes Examined:  ?                                 Sentinel: 3  ?                                 Non-sentinel: 0  ?                                 Total :3  ?     Pelvic Lymph Nodes with Metastasis: 0  ?  Macrometastasis: (>2.0 mm): 0  ?                        Micrometastasis: (>0.2 mm and < 2.0 mm): 0  ?                          Isolated Tumor Cells (<0.2 mm): 0  ?                     Laterality of Lymph Node with Tumor: Not applicable  ?                           Extracapsular Extension: Not applicable  ?     Para-aortic Lymph Nodes Examined: 0  ?Distant Metastasis:  ?     Distant Site(s) Involved: Not applicable  ?Pathologic Stage Classification (pTNM, AJCC 8th Edition): pT1a pN 0  ?Ancillary Studies: MMR / MSI testing will be ordered  ?Representative Tumor Block: D6  ?Comment(s):  Pancytokeratin was performed on the lymph nodes and is  ?negative.  ?  ? ? ?Interval History: ?Patient is overall  doing well from a postoperative standpoint.  Had very light vaginal spotting for approximately 1 week, this has resolved.  Reports regular bowel and bladder function.  Denies any significant abdominal or pelvic pain. ? ?Past Medical/Surgical History: ?Past Medical History:  ?Diagnosis Date  ? ADHD (attention deficit hyperactivity disorder)   ? Anxiety   ? Arthritis   ? in thumbs. more in rt thumb  ? Basal cell carcinoma   ? Leg and back of R arm   ? Depression   ? Hyperlipidemia   ? Hypokalemia   ? Hypothyroidism   ? Lower leg edema   ? PMB (postmenopausal bleeding) 01/2002  ? endo polyp  ? Squamous cell skin cancer   ? Thyroid disease 2002  ? hypothyroidism  ? ? ?Past Surgical History:  ?Procedure Laterality Date  ? CHOLECYSTECTOMY, LAPAROSCOPIC  06/07/2011  ? HAMMERTOE RECONSTRUCTION WITH WEIL OSTEOTOMY Right 09/25/2017  ? Procedure: Right 2-4 metatarsal Weil osteotomies and hammertoe corrections;  Surgeon: Wylene Simmer, MD;  Location: [place];  Service: Orthopedics;  Laterality: Right;  ? HAND SURGERY Right   ? HYSTEROSCOPY  01/07/2002  ? endo polyp and focal simple hyperplasia  ? LEFT THUMB SURGERY     ? POPLITEAL SYNOVIAL CYST EXCISION Left 2008 and 2010  ? ROBOTIC ASSISTED TOTAL HYSTERECTOMY WITH BILATERAL SALPINGO OOPHERECTOMY Bilateral 11/13/2021  ? Procedure: XI ROBOTIC ASSISTED TOTAL HYSTERECTOMY WITH BILATERAL SALPINGO,OOPHORECTOMY;  Surgeon: [name], MD;  Location: [facility];  Service: Gynecology;  Laterality: Bilateral;  ? SENTINEL NODE BIOPSY N/A 11/13/2021  ? Procedure: SENTINEL NODE BIOPSY;  Surgeon: [name], MD;  Location: [facility];  Service: Gynecology;  Laterality: N/A;  ? TONSILLECTOMY    ? ? ?Family History  ?Problem Relation Age of Onset  ? Cancer Mother   ?     multiple myeloma  ? Cancer Father   ?     oral  ? Diabetes Father   ? Diabetes Brother   ? Diabetes Brother   ? Heart disease Brother 78  ?     secondary to drug use  ? Uterine cancer Maternal  Grandmother   ? Diabetes Paternal Grandmother   ? Osteoporosis Other   ? Colon cancer Neg Hx   ? ? ?Social History  ? ?  Socioeconomic History  ? Marital status: Widowed  ?  Spouse name: Not on file  ? Number of children: 2  ? Years of education: Not on file  ? Highest education level: Not on file  ?Occupational History  ? Occupation: retired  ?Tobacco Use  ? Smoking status: Never  ? Smokeless tobacco: Never  ?Vaping Use  ? Vaping Use: Never used  ?Substance and Sexual Activity  ? Alcohol use: No  ? Drug use: No  ? Sexual activity: Not Currently  ?  Partners: Male  ?  Birth control/protection: Post-menopausal  ?Other Topics Concern  ? Not on file  ?Social History Narrative  ? Husband died 2006-10-25. Not working outside the home.  ? ?Social Determinants of Health  ? ?Financial Resource Strain: Not on file  ?Food Insecurity: Not on file  ?Transportation Needs: Not on file  ?Physical Activity: Not on file  ?Stress: Not on file  ?Social Connections: Not on file  ? ? ?Current Medications: ? ?Current Outpatient Medications:  ?  ACETAMINOPHEN-BUTALBITAL 50-325 MG TABS, Take 1 tablet by mouth daily as needed (tension headache)., Disp: , Rfl:  ?  amphetamine-dextroamphetamine (ADDERALL XR) 30 MG 24 hr capsule, Take 30 mg by mouth every morning., Disp: , Rfl:  ?  ASPERCREME LIDOCAINE EX, Apply 1 application topically daily as needed (pain)., Disp: , Rfl:  ?  aspirin EC 81 MG tablet, Take 81 mg by mouth every morning. Swallow whole., Disp: , Rfl:  ?  b complex vitamins capsule, Take 1 capsule by mouth daily., Disp: , Rfl:  ?  BIOTIN PO, Take 1 capsule by mouth at bedtime., Disp: , Rfl:  ?  bisacodyl (DULCOLAX) 5 MG EC tablet, Take 5 mg by mouth daily as needed (constipation)., Disp: , Rfl:  ?  buPROPion (WELLBUTRIN XL) 300 MG 24 hr tablet, Take 300 mg by mouth every morning., Disp: , Rfl:  ?  cholecalciferol (VITAMIN D) 1000 UNITS tablet, Take 2,000 Units by mouth at bedtime., Disp: , Rfl:  ?  diclofenac Sodium (VOLTAREN) 1 % GEL,  Apply 1 application topically daily as needed (pain)., Disp: , Rfl:  ?  [START ON 12/07/2021] estradiol (ESTRACE VAGINAL) 0.1 MG/GM vaginal cream, Place 1 Applicatorful vaginally 3 (three) times a week., Disp: 42.5 g, R

## 2021-12-12 ENCOUNTER — Ambulatory Visit: Payer: Medicare PPO

## 2021-12-12 ENCOUNTER — Ambulatory Visit: Payer: Medicare PPO | Admitting: Radiation Oncology

## 2021-12-17 NOTE — Progress Notes (Signed)
GYN Location of Tumor / Histology:  ?Endometrioid adenocarcinoma grade 2 ? ?Catherine Peterson presented with symptoms of: postmenopausal bleeding. She starting spotting in August 2022, it started out just occurring on occasion, then became daily. In late January 2023 she noticed more red blood with a little clot, and followed-up with her GYN Dr. Sumner Boast  ? ?Biopsies revealed:  ?11/13/2021 ?FINAL MICROSCOPIC DIAGNOSIS:  ?A. LYMPH NODE, SENTINEL, RIGHT OBTURATOR, EXCISION:  ?- No carcinoma identified in 1 lymph node (0/1).  ?B. LYMPH NODE, SENTINEL, RIGHT OBTURATOR #2, EXCISION:  ?- No carcinoma identified in 1 lymph node (0/1).  ?C. LYMPH NODE, SENTINEL, LEFT OBTURATOR, EXCISION:  ?- No carcinoma identified in 1 lymph node (0/1).  ?D. UTERUS, CERVIX, BILATERAL FALLOPIAN TUBES AND OVARIES:  ?- Endometrioid adenocarcinoma, FIGO grade 2, with 0.1 cm maximal invasion into myometrium (where myometrial thickness is 1.1 cm).  ?- Tumor invades less than half of of myometrial thickness.  ?- Cervix: Unremarkable.  No atypia, dysplasia, or malignancy.  ?Endometrial carcinoma does not involve cervix.  ?- Bilateral fallopian tubes: Unremarkable segments of fimbriated  ?fallopian tube.  No malignancy.  ?- Right ovary: Benign cyst.  No malignancy.  ?- Left ovary: Benign cysts.  No malignancy.  ?ADDENDUM:  ?E.  ?Mismatch Repair Protein ([facility])  ?SUMMARY INTERPRETATION: NORMAL  ? ?Past/Anticipated interventions by Gyn/Onc surgery, if any:  ?12/06/2021 ?--Dr. Jeral Pinch (office note) ?Patient is overall doing well although healing a little bit more slowly at her vaginal apex which I suspect is secondary to atrophy.   ?I recommended that we do some vaginal estrogen to help with tissue healing.   ?I will plan to see the patient back in 4 weeks before she would start vaginal brachytherapy to assure sufficient healing.   ?I will reach out to the radiation oncologist that she is seeing to let her know this plan. ?Discussed MMR  and MSI testing.  Given these results, very likely that this was a sporadic cancer ? ?11/13/2021 ?--Dr. Jeral Pinch ?Robotic-assisted laparoscopic total hysterectomy with bilateral salpingo-oophorectomy ?SLN biopsy ?Repair of posterior vaginal laceration  ? ?Past/Anticipated interventions by medical oncology, if any:  ?No referral placed at this time ? ?Weight changes, if any: Patient denies ? ?Bowel/Bladder complaints, if any:  Patient denies any changes urinary habits (though she did experience burning/dysuria after her surgery from catheter placement), and reports regular bowel movement ? ?Nausea/Vomiting, if any: Patient denies. Reports a stable appetite  ? ?Pain issues, if any:  Patient denies ? ?SAFETY ISSUES: ?Prior radiation? No ?Pacemaker/ICD? No ?Possible current pregnancy? No--hysterectomy  ?Is the patient on methotrexate? No ? ?Current Complaints / other details:  Planning trip to [facility] from 01/15/22-01/28/22 ? ? ? ?

## 2021-12-17 NOTE — Progress Notes (Signed)
?Radiation Oncology         [phone] ?________________________________ ? ?Initial Outpatient Consultation ? ?Name: Catherine Peterson MRN: [id number]  ?Date: 12/18/2021  DOB: 11/20/1950 ? ?KD:TOIZTI, Baldemar Friday., PA-C  [name], MD  ? ?REFERRING PHYSICIAN: [name], MD ? ?DIAGNOSIS:  ?  ICD-10-CM   ?1. Endometrial cancer ([facility])  C54.1   ?  ? ? ?FIGO grade 2 endometrioid adenocarcinoma; s/p hysterectomy and BSO; MMR normal and MSI stable  ? ? Cancer Staging  ?Endometrial cancer ([place]) ?Staging form: Corpus Uteri - Carcinoma and Carcinosarcoma, AJCC 8th Edition ?- Pathologic stage from 12/18/2021: FIGO Stage IA (pT1a, pN0, cM0) - Signed by [name], MD on 12/18/2021 ?Stage prefix: Initial diagnosis ?Histologic grade (G): G2 ?Histologic grading system: 3 grade system ? ? ?CHIEF COMPLAINT: Here to discuss management of endometrial cancer ? ?HISTORY OF PRESENT ILLNESS::Catherine Peterson is a 71 y.o. female who initially presented with postmenopausal bleeding in August of 2022. Per EMR, it seems that she did not have this evaluated until she presented to her gynecologist, Dr. Talbert Nan, on 10/29/21. During which time, the patient reported that her spotting started off as occasional and progressed to daily. The week prior to this visit she began to notice more red colored blood and a small clot. She otherwise denied any pain and pelvic exam showed no abnormalities.  ? ?Endometrial biopsies collected during the above visit (on 10/29/21) revealed FIGO grade 2 endometrioid adenocarcinoma. Pap also performed revealed endometrial adenocarcinoma. ? ?Accordingly, the patient was referred to Dr. Berline Lopes on 11/05/21 for further evaluation. During this visit, the patient again denied any pain and had no abnormalities on pelvic examination. The patient was noted to report a hysteroscopy with endometrial sampling back in 2003, which showed a polyp and focal simple hyperplasia.  She does not remember being  put on any progesterone after that.  She was also on HRT until 2005 with both Estrace as well as Provera.  ? ?CT of the abdomen and pelvis with contrast ordered by Dr. Berline Lopes on 11/09/21 poorly evaluated the patient's known endometrial cancer, but otherwise showed no evidence of metastatic disease within the abdomen or pelvis. A homogeneous low-density 15 mm right ovarian cyst was also appreciated, though no follow-up imaging in recommended in regards to this finding.  ? ?Following discussion of the risks and benefits, the patient opted to proceed with hysterectomy, BSO, and nodal biopsies on 11/13/21 under the care of Dr. Berline Lopes. Pathology from the procedure revealed histology of FIGO grade 2 endometrioid adenocarcinoma (MSI stable and MMR normal) with 9% myometrial invasion (myometrial thickness of 1.1 cm with invasion of 0.1 cm). Bilateral ovaries, fallopian tubes, and cervix all negative for malignancy. Nodal status of 3/3 sentinel obturator lymph nodes negative for malignancy (2 right and 1 left).   ? ?Per her most recent follow-up visit with Dr. Berline Lopes on 12/06/21, the patient was noted to be doing very well from a post-op standpoint. The patient did report some light vaginal spotting for approximately 1 week after her procedure but this has since resolved. Otherwise, she denied any abdominal/ pelvic pain and reported regular bowel and bladder function. Pelvic exam performed during this visit revealed a narrow vaginal apex, which Dr. Berline Lopes suspects is secondary to atrophy. Given that she appears to be healing slowly, Dr. Berline Lopes recommends vaginal estrogens to help with tissue healing. (Agglutination of the midportion of the vagina and minimal spotting were also noted).  ? ?Of note: The patient has had  bilateral LE swelling for some time which prompted a DVT study on 11/15/21. Findings showed no evidence of DVT in either lower extremity.  ? ?Weight changes, if any: Patient denies ? ?Bowel/Bladder complaints,  if any:  Patient denies any changes urinary habits (though she did experience burning/dysuria after her surgery from catheter placement), and reports regular bowel movement ? ?Nausea/Vomiting, if any: Patient denies. Reports a stable appetite  ? ?Pain issues, if any:  Patient denies ? ?SAFETY ISSUES: ?Prior radiation? No ?Pacemaker/ICD? No ?Possible current pregnancy? No--hysterectomy  ?Is the patient on methotrexate? No ? ?Current Complaints / other details:  Planning trip to [place] to see family from 01/15/22-01/28/22 ?Her husband was treated with chemotherapy and radiation at our cancer center years ago for esophageal cancer. ? ? ?GYNECOLOGIC HISTORY: ?Patient's last menstrual period was 09/10/2003. ?Contraception: postmenopausal  ?Hormone replacement therapy:  She was on HRT until 2005.  By chart review, she was on both Estrace as well as Provera. ?Menopausal hormone therapy: none ?Gravida/Para: 2  ? ?PREVIOUS RADIATION THERAPY: No ? ?PAST MEDICAL HISTORY:  has a past medical history of ADHD (attention deficit hyperactivity disorder), Anxiety, Arthritis, Basal cell carcinoma, Depression, Hyperlipidemia, Hypokalemia, Hypothyroidism, Lower leg edema, PMB (postmenopausal bleeding) (01/2002), Squamous cell skin cancer, and Thyroid disease (2002).   ? ?PAST SURGICAL HISTORY: ?Past Surgical History:  ?Procedure Laterality Date  ? CHOLECYSTECTOMY, LAPAROSCOPIC  06/07/2011  ? HAMMERTOE RECONSTRUCTION WITH WEIL OSTEOTOMY Right 09/25/2017  ? Procedure: Right 2-4 metatarsal Weil osteotomies and hammertoe corrections;  Surgeon: Wylene Simmer, MD;  Location: [facility];  Service: Orthopedics;  Laterality: Right;  ? HAND SURGERY Right   ? HYSTEROSCOPY  01/07/2002  ? endo polyp and focal simple hyperplasia  ? LEFT THUMB SURGERY     ? POPLITEAL SYNOVIAL CYST EXCISION Left 2008 and 2010  ? ROBOTIC ASSISTED TOTAL HYSTERECTOMY WITH BILATERAL SALPINGO OOPHERECTOMY Bilateral 11/13/2021  ? Procedure: XI ROBOTIC ASSISTED TOTAL  HYSTERECTOMY WITH BILATERAL SALPINGO,OOPHORECTOMY;  Surgeon: [name], MD;  Location: [facility];  Service: Gynecology;  Laterality: Bilateral;  ? SENTINEL NODE BIOPSY N/A 11/13/2021  ? Procedure: SENTINEL NODE BIOPSY;  Surgeon: [name], MD;  Location: [facility];  Service: Gynecology;  Laterality: N/A;  ? TONSILLECTOMY    ? ? ?FAMILY HISTORY: family history includes Cancer in her father and mother; Diabetes in her brother, brother, father, and paternal grandmother; Heart disease (age of onset: 10) in her brother; Osteoporosis in an other family member; Uterine cancer in her maternal grandmother. ? ?SOCIAL HISTORY:  reports that she has never smoked. She has never used smokeless tobacco. She reports that she does not drink alcohol and does not use drugs. ? ?ALLERGIES: Codeine and Coumadin [warfarin] ? ?MEDICATIONS:  ?Current Outpatient Medications  ?Medication Sig Dispense Refill  ? ACETAMINOPHEN-BUTALBITAL 50-325 MG TABS Take 1 tablet by mouth daily as needed (tension headache).    ? amphetamine-dextroamphetamine (ADDERALL XR) 30 MG 24 hr capsule Take 30 mg by mouth every morning.    ? ASPERCREME LIDOCAINE EX Apply 1 application topically daily as needed (pain).    ? aspirin EC 81 MG tablet Take 81 mg by mouth every morning. Swallow whole.    ? b complex vitamins capsule Take 1 capsule by mouth daily.    ? BIOTIN PO Take 1 capsule by mouth at bedtime.    ? bisacodyl (DULCOLAX) 5 MG EC tablet Take 5 mg by mouth daily as needed (constipation).    ? buPROPion (WELLBUTRIN XL) 300 MG 24  hr tablet Take 300 mg by mouth every morning.    ? cholecalciferol (VITAMIN D) 1000 UNITS tablet Take 2,000 Units by mouth at bedtime.    ? diclofenac Sodium (VOLTAREN) 1 % GEL Apply 1 application topically daily as needed (pain).    ? estradiol (ESTRACE VAGINAL) 0.1 MG/GM vaginal cream Place 1 Applicatorful vaginally 3 (three) times a week. 39.6 g 2  ? folic acid (FOLVITE) 728 MCG tablet Take 400 mcg by mouth daily.    ?  Ginkgo Biloba 120 MG CAPS Take 120 mg by mouth at bedtime.    ? hydrochlorothiazide 25 MG tablet Take 25 mg by mouth every morning. Per patient for fluid retention not HTN    ? Ibuprofen 200 MG CAPS Take 400

## 2021-12-18 ENCOUNTER — Other Ambulatory Visit: Payer: Self-pay

## 2021-12-18 ENCOUNTER — Encounter: Payer: Self-pay | Admitting: Radiation Oncology

## 2021-12-18 ENCOUNTER — Ambulatory Visit
Admission: RE | Admit: 2021-12-18 | Discharge: 2021-12-18 | Disposition: A | Discharge status: Home / Self Care | Payer: Medicare PPO | Source: Ambulatory Visit | Attending: Radiation Oncology | Admitting: Radiation Oncology

## 2021-12-18 VITALS — BP 131/65 | HR 84 | Temp 97.7°F | Resp 18 | Ht 61.4 in | Wt 117.2 lb

## 2021-12-18 DIAGNOSIS — Z7982 Long term (current) use of aspirin: Secondary | ICD-10-CM | POA: Insufficient documentation

## 2021-12-18 DIAGNOSIS — C541 Malignant neoplasm of endometrium: Secondary | ICD-10-CM | POA: Diagnosis not present

## 2021-12-18 DIAGNOSIS — Z79899 Other long term (current) drug therapy: Secondary | ICD-10-CM | POA: Diagnosis not present

## 2021-12-18 DIAGNOSIS — Z809 Family history of malignant neoplasm, unspecified: Secondary | ICD-10-CM | POA: Diagnosis not present

## 2021-12-18 DIAGNOSIS — E039 Hypothyroidism, unspecified: Secondary | ICD-10-CM | POA: Diagnosis not present

## 2021-12-18 DIAGNOSIS — E785 Hyperlipidemia, unspecified: Secondary | ICD-10-CM | POA: Insufficient documentation

## 2021-12-18 DIAGNOSIS — F909 Attention-deficit hyperactivity disorder, unspecified type: Secondary | ICD-10-CM | POA: Insufficient documentation

## 2021-12-18 DIAGNOSIS — Z79890 Hormone replacement therapy (postmenopausal): Secondary | ICD-10-CM | POA: Diagnosis not present

## 2021-12-18 DIAGNOSIS — M7989 Other specified soft tissue disorders: Secondary | ICD-10-CM | POA: Insufficient documentation

## 2021-12-25 ENCOUNTER — Telehealth: Payer: Self-pay

## 2021-12-25 NOTE — Telephone Encounter (Signed)
Called patient to reschedule apt. 4/24 to 41/25 @ 2:30 ?

## 2021-12-31 ENCOUNTER — Encounter: Payer: Self-pay | Admitting: Gynecologic Oncology

## 2021-12-31 ENCOUNTER — Inpatient Hospital Stay: Payer: Medicare PPO | Admitting: Gynecologic Oncology

## 2022-01-01 ENCOUNTER — Other Ambulatory Visit: Payer: Self-pay

## 2022-01-01 ENCOUNTER — Encounter: Payer: Self-pay | Admitting: Gynecologic Oncology

## 2022-01-01 ENCOUNTER — Inpatient Hospital Stay: Payer: Medicare PPO | Attending: Gynecologic Oncology | Admitting: Gynecologic Oncology

## 2022-01-01 ENCOUNTER — Telehealth: Payer: Self-pay | Admitting: *Deleted

## 2022-01-01 VITALS — BP 138/68 | HR 86 | Temp 97.7°F | Resp 18 | Wt 117.0 lb

## 2022-01-01 DIAGNOSIS — Z90722 Acquired absence of ovaries, bilateral: Secondary | ICD-10-CM

## 2022-01-01 DIAGNOSIS — Z90710 Acquired absence of both cervix and uterus: Secondary | ICD-10-CM

## 2022-01-01 DIAGNOSIS — C541 Malignant neoplasm of endometrium: Secondary | ICD-10-CM

## 2022-01-01 DIAGNOSIS — N952 Postmenopausal atrophic vaginitis: Secondary | ICD-10-CM

## 2022-01-01 NOTE — Patient Instructions (Signed)
You are healing well.  I am going to clear you to start vaginal radiation as scheduled.  I will see you after you finish treatment. ?

## 2022-01-01 NOTE — Telephone Encounter (Signed)
CALLED PATIENT TO INFORM OF APPTS. FOR 01-08-22, LVM FOR A RETURN CALL ?

## 2022-01-01 NOTE — Progress Notes (Signed)
Gynecologic Oncology Return Clinic Visit ? ?01/01/2022 ? ?Reason for Visit: Vaginal cuff exam ? ?Treatment History: ?Oncology History Overview Note  ?MMR IHC intact ?MS stable ?  ?Endometrial cancer ([place])  ?10/29/2021 Initial Biopsy  ? 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 ?  ?11/05/2021 Initial Diagnosis  ? Endometrial cancer [facility]) ?  ?11/09/2021 Imaging  ? CT A/P: ?1. Patient's known uterine/cervical cancer is poorly evaluated on ?CT. No evidence of metastatic disease within the abdomen or pelvis. ?2. Homogeneous low-density 15 mm right ovarian cyst. No follow-up ?imaging recommended. Note: This recommendation does not apply to ?premenarchal patients and to those with increased risk (genetic, ?family history, elevated tumor markers or other high-risk factors) ?of ovarian cancer. Reference: JACR 2020 Feb; 17(2):248-254 ?3.  Aortic Atherosclerosis (ICD10-I70.0). ?  ?11/13/2021 Surgery  ? TRH/BSO, SLN biopsy, repair of posterior vaginal laceration ? ?Findings:  On EUA, small mobile uterus. On intra-abdominal entry, normal upper abdominal survey. Normal omentum, small and large bowel. Uterus 6-8 cm, normal in appearance. Normal appearing bilateral adnexa. No obvious adenopathy; mapping successful to bilateral obturator SLNs. No pelvic or abdominal evidence of disease.  ?  ?11/13/2021 Pathology Results  ? A. LYMPH NODE, SENTINEL, RIGHT OBTURATOR, EXCISION:  ?- No carcinoma identified in 1 lymph node (0/1).  ? ?B. LYMPH NODE, SENTINEL, RIGHT OBTURATOR #2, EXCISION:  ?- No carcinoma identified in 1 lymph node (0/1).  ? ?C. LYMPH NODE, SENTINEL, LEFT OBTURATOR, EXCISION:  ?- No carcinoma identified in 1 lymph node (0/1).  ? ?D. UTERUS, CERVIX, BILATERAL FALLOPIAN TUBES AND OVARIES:  ?- Endometrioid adenocarcinoma, FIGO grade 2, with 0.1 cm maximal  ?invasion into myometrium (where myometrial thickness is 1.1 cm).  ?- Tumor invades less than half of of myometrial thickness.  ?- Cervix: Unremarkable.  No atypia,  dysplasia, or malignancy.  ?Endometrial carcinoma does not involve cervix.  ?- Bilateral fallopian tubes: Unremarkable segments of fimbriated  ?fallopian tube.  No malignancy.  ?- Right ovary: Benign cyst.  No malignancy.  ?- Left ovary: Benign cysts.  No malignancy.  ? ?ONCOLOGY TABLE:  ? ?UTERUS, CARCINOMA OR CARCINOSARCOMA: Resection  ? ?Procedure: Total hysterectomy and bilateral salpingo-oophorectomy  ?Histologic Type: Endometrioid adenocarcinoma  ?Histologic Grade: 2  ?Myometrial Invasion:  ?     Depth of Myometrial Invasion (mm): 1 mm  ?     Myometrial Thickness (mm): 11 mm  ?     Percentage of Myometrial Invasion: 9%  ?Uterine Serosa Involvement: Not identified  ?Cervical stromal Involvement: Not identified  ?Extent of involvement of other tissue/organs: Not identified  ?Peritoneal/Ascitic Fluid: Not applicable  ?Lymphovascular Invasion: Not identified  ?Regional Lymph Nodes: Present.  No carcinoma identified in any of 3  ?lymph nodes (0/3).  ?     Pelvic Lymph Nodes Examined:  ?                                 Sentinel: 3  ?                                 Non-sentinel: 0  ?                                 Total :3  ?     Pelvic Lymph Nodes with Metastasis: 0  ?  Macrometastasis: (>2.0 mm): 0  ?                        Micrometastasis: (>0.2 mm and < 2.0 mm): 0  ?                          Isolated Tumor Cells (<0.2 mm): 0  ?                     Laterality of Lymph Node with Tumor: Not applicable  ?                           Extracapsular Extension: Not applicable  ?     Para-aortic Lymph Nodes Examined: 0  ?Distant Metastasis:  ?     Distant Site(s) Involved: Not applicable  ?Pathologic Stage Classification (pTNM, AJCC 8th Edition): pT1a pN 0  ?Ancillary Studies: MMR / MSI testing will be ordered  ?Representative Tumor Block: D6  ?Comment(s):  Pancytokeratin was performed on the lymph nodes and is  ?negative.  ?  ?12/18/2021 Cancer Staging  ? Staging form: Corpus Uteri - Carcinoma and  Carcinosarcoma, AJCC 8th Edition ?- Pathologic stage from 12/18/2021: FIGO Stage IA (pT1a, pN0, cM0) - Signed by [name], MD on 12/18/2021 ?Stage prefix: Initial diagnosis ?Histologic grade (G): G2 ?Histologic grading system: 3 grade system ? ?  ? ? ?Interval History: ?Reports doing well.  Denies any vaginal bleeding or discharge.  Had 2 days since her last visit with me where she had incontinence of diarrhea every time that she urinated.  She then had a couple days after where she had the same symptom although not every time she urinated.  She denies any fecal incontinence or diarrhea since last week.  Has been doing Kegel exercises.  Continues to have moderate hot flashes. ? ?Past Medical/Surgical History: ?Past Medical History:  ?Diagnosis Date  ? ADHD (attention deficit hyperactivity disorder)   ? Anxiety   ? Arthritis   ? in thumbs. more in rt thumb  ? Basal cell carcinoma   ? Leg and back of R arm   ? Depression   ? Hyperlipidemia   ? Hypokalemia   ? Hypothyroidism   ? Lower leg edema   ? PMB (postmenopausal bleeding) 01/2002  ? endo polyp  ? Squamous cell skin cancer   ? Thyroid disease 2002  ? hypothyroidism  ? ? ?Past Surgical History:  ?Procedure Laterality Date  ? CHOLECYSTECTOMY, LAPAROSCOPIC  06/07/2011  ? HAMMERTOE RECONSTRUCTION WITH WEIL OSTEOTOMY Right 09/25/2017  ? Procedure: Right 2-4 metatarsal Weil osteotomies and hammertoe corrections;  Surgeon: Wylene Simmer, MD;  Location: [facility];  Service: Orthopedics;  Laterality: Right;  ? HAND SURGERY Right   ? HYSTEROSCOPY  01/07/2002  ? endo polyp and focal simple hyperplasia  ? LEFT THUMB SURGERY     ? POPLITEAL SYNOVIAL CYST EXCISION Left 2008 and 2010  ? ROBOTIC ASSISTED TOTAL HYSTERECTOMY WITH BILATERAL SALPINGO OOPHERECTOMY Bilateral 11/13/2021  ? Procedure: XI ROBOTIC ASSISTED TOTAL HYSTERECTOMY WITH BILATERAL SALPINGO,OOPHORECTOMY;  Surgeon: [name], MD;  Location: [facility];  Service: Gynecology;  Laterality:  Bilateral;  ? SENTINEL NODE BIOPSY N/A 11/13/2021  ? Procedure: SENTINEL NODE BIOPSY;  Surgeon: [name], MD;  [facility];  Service: Gynecology;  Laterality: N/A;  ? TONSILLECTOMY    ? ? ?Family History  ?Problem Relation  Age of Onset  ? Cancer Mother   ?     multiple myeloma  ? Cancer Father   ?     oral  ? Diabetes Father   ? Diabetes Brother   ? Diabetes Brother   ? Heart disease Brother 79  ?     secondary to drug use  ? Uterine cancer Maternal Grandmother   ? Diabetes Paternal Grandmother   ? Osteoporosis Other   ? Colon cancer Neg Hx   ? ? ?Social History  ? ?Socioeconomic History  ? Marital status: Widowed  ?  Spouse name: Not on file  ? Number of children: 2  ? Years of education: Not on file  ? Highest education level: Master's degree (e.g., MA, MS, MEng, MEd, MSW, MBA)  ?Occupational History  ? Occupation: retired  ?Tobacco Use  ? Smoking status: Never  ? Smokeless tobacco: Never  ?Vaping Use  ? Vaping Use: Never used  ?Substance and Sexual Activity  ? Alcohol use: No  ? Drug use: No  ? Sexual activity: Not Currently  ?  Partners: Male  ?  Birth control/protection: Post-menopausal, Surgical  ?Other Topics Concern  ? Not on file  ?Social History Narrative  ? Husband died 10/24/2006. Not working outside the home.  ? ?Social Determinants of Health  ? ?Financial Resource Strain: Not on file  ?Food Insecurity: Not on file  ?Transportation Needs: Not on file  ?Physical Activity: Not on file  ?Stress: Not on file  ?Social Connections: Not on file  ? ? ?Current Medications: ? ?Current Outpatient Medications:  ?  ACETAMINOPHEN-BUTALBITAL 50-325 MG TABS, Take 1 tablet by mouth daily as needed (tension headache)., Disp: , Rfl:  ?  amphetamine-dextroamphetamine (ADDERALL XR) 30 MG 24 hr capsule, Take 30 mg by mouth every morning., Disp: , Rfl:  ?  ASPERCREME LIDOCAINE EX, Apply 1 application topically daily as needed (pain)., Disp: , Rfl:  ?  aspirin EC 81 MG tablet, Take 81 mg by mouth every morning. Swallow  whole., Disp: , Rfl:  ?  b complex vitamins capsule, Take 1 capsule by mouth daily., Disp: , Rfl:  ?  BIOTIN PO, Take 1 capsule by mouth at bedtime., Disp: , Rfl:  ?  bisacodyl (DULCOLAX) 5 MG EC tablet, Ta

## 2022-01-07 ENCOUNTER — Telehealth: Payer: Self-pay | Admitting: *Deleted

## 2022-01-07 NOTE — Telephone Encounter (Signed)
CALLED PATIENT TO REMIND OF FU AND SIM APPT. FOR 01-08-22, SPOKE WITH PATIENT AND SHE IS AWARE OF THESE APPTS. ?

## 2022-01-08 ENCOUNTER — Encounter: Payer: Self-pay | Admitting: Radiation Oncology

## 2022-01-08 ENCOUNTER — Other Ambulatory Visit: Payer: Self-pay

## 2022-01-08 ENCOUNTER — Ambulatory Visit
Admission: RE | Admit: 2022-01-08 | Discharge: 2022-01-08 | Disposition: A | Discharge status: Home / Self Care | Payer: Medicare PPO | Source: Ambulatory Visit | Attending: Radiation Oncology | Admitting: Radiation Oncology

## 2022-01-08 VITALS — BP 131/67 | HR 71 | Temp 97.5°F | Resp 18 | Ht 61.4 in | Wt 118.6 lb

## 2022-01-08 DIAGNOSIS — C541 Malignant neoplasm of endometrium: Secondary | ICD-10-CM | POA: Insufficient documentation

## 2022-01-08 DIAGNOSIS — Z7982 Long term (current) use of aspirin: Secondary | ICD-10-CM | POA: Diagnosis not present

## 2022-01-08 DIAGNOSIS — Z79899 Other long term (current) drug therapy: Secondary | ICD-10-CM | POA: Insufficient documentation

## 2022-01-08 DIAGNOSIS — Z923 Personal history of irradiation: Secondary | ICD-10-CM | POA: Diagnosis not present

## 2022-01-08 NOTE — Progress Notes (Signed)
?Radiation Oncology         [phone] ?________________________________ ? ?Name: Catherine Peterson MRN: [id number]  ?Date: 01/08/2022  DOB: 12-17-1950 ? ?Follow-Up Visit Note ? ?Outpatient ? ?CC: Aletha Halim., PA-C  [name], MD ? ?Diagnosis and Prior Radiotherapy:  ?  ICD-10-CM   ?1. Endometrial cancer ([place])  C54.1   ?  ? Cancer Staging  ?Endometrial cancer ([place]) ?Staging form: Corpus Uteri - Carcinoma and Carcinosarcoma, AJCC 8th Edition ?- Pathologic stage from 12/18/2021: FIGO Stage IA (pT1a, pN0, cM0) - Signed by [name], MD on 12/18/2021 ?Stage prefix: Initial diagnosis ?Histologic grade (G): G2 ?Histologic grading system: 3 grade system ? ? ?CHIEF COMPLAINT: Here for treatment planning of endometrial cancer ? ?Narrative:  The patient returns today for routine follow-up.  She feels well.  She has been cleared by Dr. Berline Lopes for radiation therapy.  When she was examined last week by Dr. Berline Lopes the vaginal cuff was intact although narrowed at the apex.  A cotton swab had to be used to break up adhesions on exam.  Recommendations made for continued vaginal estrogen therapy and starting a dilator.  The patient will be out of town until May 22 and plans to start her radiation therapy after that.  Today' is CT simulation planning day.                             ? ?ALLERGIES:  is allergic to codeine and coumadin [warfarin]. ? ?Meds: ?Current Outpatient Medications  ?Medication Sig Dispense Refill  ? ACETAMINOPHEN-BUTALBITAL 50-325 MG TABS Take 1 tablet by mouth daily as needed (tension headache).    ? amphetamine-dextroamphetamine (ADDERALL XR) 30 MG 24 hr capsule Take 30 mg by mouth every morning.    ? ASPERCREME LIDOCAINE EX Apply 1 application topically daily as needed (pain).    ? aspirin EC 81 MG tablet Take 81 mg by mouth every morning. Swallow whole.    ? b complex vitamins capsule Take 1 capsule by mouth daily.    ? BIOTIN PO Take 1 capsule by mouth at bedtime.    ? bisacodyl  (DULCOLAX) 5 MG EC tablet Take 5 mg by mouth daily as needed (constipation).    ? buPROPion (WELLBUTRIN XL) 300 MG 24 hr tablet Take 300 mg by mouth every morning.    ? cholecalciferol (VITAMIN D) 1000 UNITS tablet Take 2,000 Units by mouth at bedtime.    ? diclofenac Sodium (VOLTAREN) 1 % GEL Apply 1 application topically daily as needed (pain).    ? estradiol (ESTRACE VAGINAL) 0.1 MG/GM vaginal cream Place 1 Applicatorful vaginally 3 (three) times a week. 42.5 g 2  ? fluticasone (FLONASE) 50 MCG/ACT nasal spray Place 1 spray into both nostrils daily.    ? folic acid (FOLVITE) 053 MCG tablet Take 400 mcg by mouth daily.    ? Ginkgo Biloba 120 MG CAPS Take 120 mg by mouth at bedtime.    ? hydrochlorothiazide 25 MG tablet Take 25 mg by mouth every morning. Per patient for fluid retention not HTN    ? Ibuprofen 200 MG CAPS Take 400 mg by mouth 2 (two) times daily as needed (pain).    ? Melatonin 10 MG TABS Take 10 mg by mouth at bedtime.    ? Multiple Vitamin (MULTIVITAMIN WITH MINERALS) TABS tablet Take 1 tablet by mouth at bedtime.    ? Probiotic Product (PROBIOTIC PO) Take 1 tablet by mouth at  bedtime.    ? thyroid (ARMOUR) 60 MG tablet Take 60 mg by mouth daily before breakfast.    ? Turmeric 500 MG CAPS Take 500 mg by mouth at bedtime.    ? vitamin k 100 MCG tablet Take 100 mcg by mouth at bedtime.    ? ?No current facility-administered medications for this encounter.  ? ? ?Physical Findings: ?The patient is in no acute distress. Patient is alert and oriented. ? height is 5' 1.4" (1.56 m) and weight is 118 lb 9.6 oz (53.8 kg). Her temperature is 97.5 ?F (36.4 ?C) (abnormal). Her blood pressure is 131/67 and her pulse is 71. Her respiration is 18 and oxygen saturation is 100%. Marland Kitchen    ?GYN: External genitalia appears normal.   Digital and speculum exam reveal narrowing at the apex of the vagina.  A cotton swab was inserted to break up some thin adhesions which was successfully accomplished without any bleeding.  No  evidence of dehiscence at vaginal apex.  A 2.5 cm with HDR cylinder was successfully inserted within the vagina without any difficulty ? ? ?Lab Findings: ?Lab Results  ?Component Value Date  ? WBC 5.9 11/06/2021  ? HGB 14.3 11/06/2021  ? HCT 41.4 11/06/2021  ? MCV 91.0 11/06/2021  ? PLT 269 11/06/2021  ? ? ?Radiographic Findings: ?No results found. ? ?Impression/Plan: She is ready for radiation planning.  Anticipate 30 Gray in 5 fractions delivered twice a week to the vaginal cuff.  She does have some narrowing at the apex of the vagina  -  during CT simulation I will determine if a 2.5 cm or 2 cm with vaginal brachytherapy cylinder is most appropriate - the main goal will be to minimize air gaps at the vaginal apex, and this can be determined with three-dimensional imaging.  She has an upcoming trip and therefore we will hold her radiation to start on May 23.  We will give her some dilators  and lubricant to use to prevent adhesions while she is out of town and she knows to apply these intravaginally for 5 minutes a day every day. ? ?On date of service, in total, I spent 25 minutes on this encounter. Patient was seen in person.  ?_____________________________________ ? ? ?[name], MD ? ?

## 2022-01-18 ENCOUNTER — Encounter: Payer: Self-pay | Admitting: Gynecologic Oncology

## 2022-01-21 ENCOUNTER — Encounter: Payer: Self-pay | Admitting: Oncology

## 2022-01-23 DIAGNOSIS — C541 Malignant neoplasm of endometrium: Secondary | ICD-10-CM | POA: Diagnosis not present

## 2022-01-28 ENCOUNTER — Telehealth: Payer: Self-pay | Admitting: *Deleted

## 2022-01-28 NOTE — Telephone Encounter (Signed)
CALLED PATIENT TO REMIND OF HDR [place] 01-29-22 @ 2 PM , SPOKE WITH PATIENT AND SHE IS AWARE OF THIS [place].

## 2022-01-29 ENCOUNTER — Other Ambulatory Visit: Payer: Self-pay

## 2022-01-29 ENCOUNTER — Ambulatory Visit
Admission: RE | Admit: 2022-01-29 | Discharge: 2022-01-29 | Disposition: A | Discharge status: Home / Self Care | Payer: Medicare PPO | Source: Ambulatory Visit | Attending: Radiation Oncology | Admitting: Radiation Oncology

## 2022-01-29 DIAGNOSIS — C541 Malignant neoplasm of endometrium: Secondary | ICD-10-CM

## 2022-01-29 LAB — RAD ONC ARIA SESSION SUMMARY
Course Elapsed Days: 0
Plan Fractions Treated to Date: 1
Plan Prescribed Dose Per Fraction: 6 Gy
Plan Total Fractions Prescribed: 5
Plan Total Prescribed Dose: 30 Gy
Reference Point Dosage Given to Date: 6 Gy
Reference Point Session Dosage Given: 6 Gy
Session Number: 1

## 2022-01-31 ENCOUNTER — Telehealth: Payer: Self-pay | Admitting: *Deleted

## 2022-01-31 NOTE — Telephone Encounter (Signed)
CALLED PATIENT TO REMIND OF HDR [place] 02-01-22 @ 9 AM, SPOKE WITH PATIENT AND SHE IS AWARE OF THIS [place].

## 2022-02-01 ENCOUNTER — Other Ambulatory Visit: Payer: Self-pay

## 2022-02-01 ENCOUNTER — Ambulatory Visit
Admission: RE | Admit: 2022-02-01 | Discharge: 2022-02-01 | Disposition: A | Discharge status: Home / Self Care | Payer: Medicare PPO | Source: Ambulatory Visit | Attending: Radiation Oncology | Admitting: Radiation Oncology

## 2022-02-01 ENCOUNTER — Telehealth: Payer: Self-pay | Admitting: *Deleted

## 2022-02-01 DIAGNOSIS — C541 Malignant neoplasm of endometrium: Secondary | ICD-10-CM | POA: Diagnosis not present

## 2022-02-01 LAB — RAD ONC ARIA SESSION SUMMARY
Course Elapsed Days: 3
Plan Fractions Treated to Date: 2
Plan Prescribed Dose Per Fraction: 6 Gy
Plan Total Fractions Prescribed: 5
Plan Total Prescribed Dose: 30 Gy
Reference Point Dosage Given to Date: 12 Gy
Reference Point Session Dosage Given: 6 Gy
Session Number: 2

## 2022-02-01 NOTE — Telephone Encounter (Signed)
Called patient to remind of HDR Tx. for 02-05-22 @ 9 am, spoke with patient and she is aware of this tx,

## 2022-02-05 ENCOUNTER — Ambulatory Visit
Admission: RE | Admit: 2022-02-05 | Discharge: 2022-02-05 | Disposition: A | Discharge status: Home / Self Care | Payer: Medicare PPO | Source: Ambulatory Visit | Attending: Radiation Oncology | Admitting: Radiation Oncology

## 2022-02-05 ENCOUNTER — Other Ambulatory Visit: Payer: Self-pay

## 2022-02-05 DIAGNOSIS — C541 Malignant neoplasm of endometrium: Secondary | ICD-10-CM

## 2022-02-05 LAB — RAD ONC ARIA SESSION SUMMARY
Course Elapsed Days: 7
Plan Fractions Treated to Date: 3
Plan Prescribed Dose Per Fraction: 6 Gy
Plan Total Fractions Prescribed: 5
Plan Total Prescribed Dose: 30 Gy
Reference Point Dosage Given to Date: 18 Gy
Reference Point Session Dosage Given: 6 Gy
Session Number: 3

## 2022-02-07 ENCOUNTER — Telehealth: Payer: Self-pay | Admitting: *Deleted

## 2022-02-07 NOTE — Telephone Encounter (Signed)
CALLED PATIENT TO REMIND OF HDR TX. ON 02-08-22 @ 9 AM, SPOKE WITH PATIENT AND SHE IS AWARE OF THIS [place].

## 2022-02-08 ENCOUNTER — Ambulatory Visit
Admission: RE | Admit: 2022-02-08 | Discharge: 2022-02-08 | Disposition: A | Discharge status: Home / Self Care | Payer: Medicare PPO | Source: Ambulatory Visit | Attending: Radiation Oncology | Admitting: Radiation Oncology

## 2022-02-08 ENCOUNTER — Other Ambulatory Visit: Payer: Self-pay

## 2022-02-08 DIAGNOSIS — C541 Malignant neoplasm of endometrium: Secondary | ICD-10-CM | POA: Insufficient documentation

## 2022-02-08 LAB — RAD ONC ARIA SESSION SUMMARY
Course Elapsed Days: 10
Plan Fractions Treated to Date: 4
Plan Prescribed Dose Per Fraction: 6 Gy
Plan Total Fractions Prescribed: 5
Plan Total Prescribed Dose: 30 Gy
Reference Point Dosage Given to Date: 24 Gy
Reference Point Session Dosage Given: 6 Gy
Session Number: 4

## 2022-02-11 ENCOUNTER — Telehealth: Payer: Self-pay | Admitting: *Deleted

## 2022-02-11 NOTE — Telephone Encounter (Signed)
CALLED PATIENT TO REMIND OF HDR Catherine Peterson 02-12-22 @ 9 AM, SPOKE WITH PATIENT AND SHE IS AWARE OF THIS [place]

## 2022-02-12 ENCOUNTER — Encounter: Payer: Self-pay | Admitting: Radiation Oncology

## 2022-02-12 ENCOUNTER — Other Ambulatory Visit: Payer: Self-pay

## 2022-02-12 ENCOUNTER — Ambulatory Visit
Admission: RE | Admit: 2022-02-12 | Discharge: 2022-02-12 | Disposition: A | Discharge status: Home / Self Care | Payer: Medicare PPO | Source: Ambulatory Visit | Attending: Radiation Oncology | Admitting: Radiation Oncology

## 2022-02-12 DIAGNOSIS — C541 Malignant neoplasm of endometrium: Secondary | ICD-10-CM

## 2022-02-12 LAB — RAD ONC ARIA SESSION SUMMARY
Course Elapsed Days: 14
Plan Fractions Treated to Date: 5
Plan Prescribed Dose Per Fraction: 6 Gy
Plan Total Fractions Prescribed: 5
Plan Total Prescribed Dose: 30 Gy
Reference Point Dosage Given to Date: 30 Gy
Reference Point Session Dosage Given: 6 Gy
Session Number: 5

## 2022-02-25 NOTE — Addendum Note (Signed)
Encounter addended by: [name], MD on: 02/25/2022 5:28 PM  Actions taken: Clinical Note Signed

## 2022-02-25 NOTE — Addendum Note (Signed)
Encounter addended by: [name], MD on: 02/25/2022 5:26 PM  Actions taken: Clinical Note Signed

## 2022-02-25 NOTE — Progress Notes (Addendum)
Catherine Peterson DOB 05/12/1951  Diagnosis:  Endometrial cancer ([place])   ICD-10-CM   1. Endometrial cancer ([place])  C54.1       HDR procedure note  Fraction 3 DOS: 02-05-22   Vaginal brachytherapy procedure: The patient was placed on the high-dose rate treatment table in the dorsal lithotomy position. The patient then proceeded to undergo serial dilatation of the vaginal introitus. The patient's previously constructed vaginal cylinder was then placed in the proximal vagina. The cylinder was then affixed to the stabilization plate to prevent slippage. The patient tolerated the  procedure well.   Verification simulation:   An AP and lateral film was obtained. These were compared to the patient's planning films showing good position of the vaginal cylinder for treatment.   High-dose rate treatment: The patient then had attachment of her vaginal cylinder to the remote afterloading device. The patient then proceeded to undergo high-dose rate treatment directed at the proximal vagina. The patient was prescribed a dose of 6 Gy to be delivered to the mucosal surface. This was achieved with a total dwell time of 163.2 seconds. The patient was treated with 6 dwell positions using 1 catheter. Iridium 192 was the high-dose rate source. After completion of the patient's therapy a radiation survey was performed documenting return of the iridium source into the safe.  -----------------------------------  [name], MD

## 2022-02-25 NOTE — Progress Notes (Addendum)
Catherine Peterson DOB May 01, 1951  Diagnosis:  Endometrial cancer ([place])   ICD-10-CM   1. Endometrial cancer ([place])  C54.1     HDR procedure note  Fraction 4 DOS: 02-08-22   Vaginal brachytherapy procedure: The patient was placed on the high-dose rate treatment table in the dorsal lithotomy position. The patient then proceeded to undergo serial dilatation of the vaginal introitus. The patient's previously constructed vaginal cylinder was then placed in the proximal vagina. The cylinder was then affixed to the stabilization plate to prevent slippage. The patient tolerated the  procedure well.   Verification simulation:   An AP and lateral film was obtained. These were compared to the patient's planning films showing good position of the vaginal cylinder for treatment.   High-dose rate treatment: The patient then had attachment of her vaginal cylinder to the remote afterloading device. The patient then proceeded to undergo high-dose rate treatment directed at the proximal vagina. The patient was prescribed a dose of 6 Gy to be delivered to the mucosal surface. This was achieved with a total dwell time of 167.9 seconds. The patient was treated with 6 dwell positions using 1 catheter. Iridium 192 was the high-dose rate source. After completion of the patient's therapy a radiation survey was performed documenting return of the iridium source into the safe.  -----------------------------------  [name], MD

## 2022-02-25 NOTE — Addendum Note (Signed)
Encounter addended by: [name], MD on: 02/25/2022 5:27 PM  Actions taken: Clinical Note Signed

## 2022-02-25 NOTE — Progress Notes (Addendum)
Mckinzey Entwistle Glendenning DOB 1951-07-03  Diagnosis:  Endometrial cancer ([place])   ICD-10-CM   1. Endometrial cancer ([place])  C54.1       HDR procedure note  Fraction 5 DOS: 02-12-22  Vaginal brachytherapy procedure: The patient was placed on the high-dose rate treatment table in the dorsal lithotomy position. The patient then proceeded to undergo serial dilatation of the vaginal introitus. The patient's previously constructed vaginal cylinder was then placed in the proximal vagina. The cylinder was then affixed to the stabilization plate to prevent slippage. The patient tolerated the  procedure well.   Verification simulation:   An AP and lateral film was obtained. These were compared to the patient's planning films showing good position of the vaginal cylinder for treatment.   High-dose rate treatment: The patient then had attachment of her vaginal cylinder to the remote afterloading device. The patient then proceeded to undergo high-dose rate treatment directed at the proximal vagina. The patient was prescribed a dose of 6 Gy to be delivered to the mucosal surface. This was achieved with a total dwell time of 174.2 seconds. The patient was treated with 6 dwell positions using 1 catheter. Iridium 192 was the high-dose rate source. After completion of the patient's therapy a radiation survey was performed documenting return of the iridium source into the safe.  -----------------------------------  [name], MD

## 2022-02-25 NOTE — Addendum Note (Signed)
Encounter addended by: [name], MD on: 02/25/2022 5:20 PM  Actions taken: Clinical Note Signed

## 2022-02-25 NOTE — Progress Notes (Addendum)
Catherine Peterson DOB [birth year]  Diagnosis:  Endometrial cancer ([place])   ICD-10-CM   1. Endometrial cancer ([place])  C54.1      HDR procedure note  Fraction 2 DOS: 02-01-22  Vaginal brachytherapy procedure: The patient was placed on the high-dose rate treatment table in the dorsal lithotomy position. The patient then proceeded to undergo serial dilatation of the vaginal introitus. The patient's previously constructed vaginal cylinder was then placed in the proximal vagina. The cylinder was then affixed to the stabilization plate to prevent slippage. The patient tolerated the  procedure well.   Verification simulation:   An AP and lateral film was obtained. These were compared to the patient's planning films showing good position of the vaginal cylinder for treatment.   High-dose rate treatment: The patient then had attachment of her vaginal cylinder to the remote afterloading device. The patient then proceeded to undergo high-dose rate treatment directed at the proximal vagina. The patient was prescribed a dose of 6 Gy to be delivered to the mucosal surface. This was achieved with a total dwell time of 157.3 seconds. The patient was treated with 6 dwell positions using 1 catheter. Iridium 192 was the high-dose rate source. After completion of the patient's therapy a radiation survey was performed documenting return of the iridium source into the safe.  -----------------------------------  [name], MD

## 2022-02-25 NOTE — Addendum Note (Signed)
Encounter addended by: [name], MD on: 02/25/2022 5:18 PM  Actions taken: Clinical Note Signed

## 2022-02-25 NOTE — Addendum Note (Signed)
Encounter addended by: [name], MD on: 02/25/2022 5:21 PM  Actions taken: Clinical Note Signed

## 2022-02-25 NOTE — Progress Notes (Addendum)
Catherine Peterson DOB [birth year]  Diagnosis:  Endometrial cancer ([place])   ICD-10-CM   1. Endometrial cancer ([place])  C54.1      HDR procedure note  Fraction 1 DOS: 01-29-22  Vaginal brachytherapy procedure: The patient was placed on the high-dose rate treatment table in the dorsal lithotomy position. The patient then proceeded to undergo serial dilatation of the vaginal introitus. The patient's previously constructed vaginal cylinder was then placed in the proximal vagina. The cylinder was then affixed to the stabilization plate to prevent slippage. The patient tolerated the  procedure well.   Verification simulation:   An AP and lateral film was obtained. These were compared to the patient's planning films showing good position of the vaginal cylinder for treatment.   High-dose rate treatment: The patient then had attachment of her vaginal cylinder to the remote afterloading device. The patient then proceeded to undergo high-dose rate treatment directed at the proximal vagina. The patient was prescribed a dose of 6 Gy to be delivered to the mucosal surface. This was achieved with a total dwell time of 152.9 seconds. The patient was treated with 6 dwell positions using 1 catheter. Iridium 192 was the high-dose rate source. After completion of the patient's therapy a radiation survey was performed documenting return of the iridium source into the safe.  -----------------------------------  [name], MD

## 2022-03-01 ENCOUNTER — Other Ambulatory Visit: Payer: Self-pay | Admitting: Otolaryngology

## 2022-03-13 ENCOUNTER — Telehealth: Payer: Self-pay

## 2022-03-13 NOTE — Telephone Encounter (Signed)
Spoke with patient, per Dr. Berline Lopes scheduled patient for a 3 month follow up on 06/14/22 at 1:30pm. Patient is in agreement of appointment date and time.

## 2022-03-13 NOTE — Progress Notes (Signed)
                                                                                                                                                             Patient Name: Catherine Peterson MRN: [id number] DOB: August 16, 1951 Referring Physician: Bing Matter (Profile Not Attached) Date of Service: 02/12/2022 [facility], [place]                                                        End Of Treatment Note  Diagnoses: C54.1-Malignant neoplasm of endometrium  Cancer Staging:  Cancer Staging  Endometrial cancer [facility]) Staging form: Corpus Uteri - Carcinoma and Carcinosarcoma, AJCC 8th Edition - Pathologic stage from 12/18/2021: FIGO Stage IA (pT1a, pN0, cM0) - Signed by [name], MD on 12/18/2021 Stage prefix: Initial diagnosis Histologic grade (G): G2 Histologic grading system: 3 grade system  Intent: Curative  Radiation Treatment Dates: 01/29/2022 through 02/12/2022 Site Technique Total Dose (Gy) Dose per Fx (Gy) Completed Fx Beam Energies  Vagina: Pelvis_vagina HDR-brachy 30/30 6 5/5 Ir-192   Narrative: The patient tolerated radiation therapy relatively well.   Plan: The patient will follow-up with radiation oncology in 40mo.  -----------------------------------  SEppie Gibson MD

## 2022-03-14 DIAGNOSIS — M545 Low back pain, unspecified: Secondary | ICD-10-CM | POA: Insufficient documentation

## 2022-03-14 NOTE — Progress Notes (Signed)
71 y.o. G36P2002 Widowed White or Caucasian Not Hispanic or Latino female here for annual exam.  She was diagnosed with endometrial cancer in 2/23. In 3/23 she underwent RTLH/BSO/SLN biopsy. 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 She then underwent radiation treatment to decrease her risk of recurrence.   She is using a dilator 3-4x a week She states that her energy level has returned since finishing radiation. She is using estrogen vaginal cream. No current plans to be sexually active.  No vaginal bleeding.  No bowel or bladder c/o.   Patient's last menstrual period was 09/10/2003.          Sexually active: No.  The current method of family planning is status post hysterectomy.    Exercising: Yes.     Walking and yard work  Smoker:  no  Health Maintenance: Pap:  01-13-17 Neg, 12-16-13 Neg:Neg HR HPV History of abnormal Pap:  no MMG:  07/04/21 Density C Bi-rads 1 neg  BMD: 07-16-2019, T score -2.0, FRAX 9.4/1.5% Colonoscopy: 03/27/20: upper and lower endoscopy, she had one colon polyp. Needs f/u in 5 years.   TDaP:  2020 Gardasil: n/a   reports that she has never smoked. She has never used smokeless tobacco. She reports that she does not drink alcohol and does not use drugs. Retired. Both sons are in [place]. 4 grand kids. (66,21 and 9 year old twins). She has a huge network here.   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 more in rt thumb   Basal cell carcinoma    Leg and back of R arm    Depression    Hyperlipidemia    Hypokalemia    Hypothyroidism    Lower leg edema    PMB (postmenopausal bleeding) 01/2002   endo polyp   Squamous cell skin cancer    Thyroid disease 2002   hypothyroidism    Past Surgical History:  Procedure Laterality Date   CHOLECYSTECTOMY, LAPAROSCOPIC  06/07/2011   HAMMERTOE RECONSTRUCTION WITH WEIL OSTEOTOMY Right 09/25/2017   Procedure:  Right 2-4 metatarsal Weil osteotomies and hammertoe corrections;  Surgeon: Wylene Simmer, MD;  Location: [facility];  Service: Orthopedics;  Laterality: Right;   HAND SURGERY Right    HYSTEROSCOPY  01/07/2002   endo polyp and focal simple hyperplasia   LEFT THUMB SURGERY      POPLITEAL SYNOVIAL CYST EXCISION Left 2008 and 2010   ROBOTIC ASSISTED TOTAL HYSTERECTOMY WITH BILATERAL SALPINGO OOPHERECTOMY Bilateral 11/13/2021   Procedure: XI ROBOTIC ASSISTED TOTAL HYSTERECTOMY WITH BILATERAL SALPINGO,OOPHORECTOMY;  Surgeon: [name], MD;  Location: [facility];  Service: Gynecology;  Laterality: Bilateral;   SENTINEL NODE BIOPSY N/A 11/13/2021   Procedure: SENTINEL NODE BIOPSY;  Surgeon: [name], MD;  Location: [facility];  Service: Gynecology;  Laterality: N/A;   TONSILLECTOMY      Current Outpatient Medications  Medication Sig Dispense Refill   ACETAMINOPHEN-BUTALBITAL 50-325 MG TABS Take 1 tablet by mouth daily as needed (tension headache).     amphetamine-dextroamphetamine (ADDERALL XR) 30 MG 24 hr capsule Take 30 mg by mouth every morning.     ASPERCREME LIDOCAINE EX Apply 1 application topically daily as needed (pain).     aspirin EC 81 MG tablet Take 81 mg by mouth every morning. Swallow whole.     b complex vitamins capsule Take 1 capsule by mouth daily.  BIOTIN PO Take 1 capsule by mouth at bedtime.     bisacodyl (DULCOLAX) 5 MG EC tablet Take 5 mg by mouth daily as needed (constipation).     buPROPion (WELLBUTRIN XL) 300 MG 24 hr tablet Take 300 mg by mouth every morning.     cholecalciferol (VITAMIN D) 1000 UNITS tablet Take 2,000 Units by mouth at bedtime.     diclofenac Sodium (VOLTAREN) 1 % GEL Apply 1 application topically daily as needed (pain).     estradiol (ESTRACE VAGINAL) 0.1 MG/GM vaginal cream Place 1 Applicatorful vaginally 3 (three) times a week. 42.5 g 2   fluticasone (FLONASE) 50 MCG/ACT nasal spray Place 1 spray into both nostrils daily.      folic acid (FOLVITE) 591 MCG tablet Take 400 mcg by mouth daily.     Ginkgo Biloba 120 MG CAPS Take 120 mg by mouth at bedtime.     hydrochlorothiazide 25 MG tablet Take 25 mg by mouth every morning. Per patient for fluid retention not HTN     Ibuprofen 200 MG CAPS Take 400 mg by mouth 2 (two) times daily as needed (pain).     Melatonin 10 MG TABS Take 10 mg by mouth at bedtime.     Multiple Vitamin (MULTIVITAMIN WITH MINERALS) TABS tablet Take 1 tablet by mouth at bedtime.     Probiotic Product (PROBIOTIC PO) Take 1 tablet by mouth at bedtime.     thyroid (ARMOUR) 60 MG tablet Take 60 mg by mouth daily before breakfast.     Turmeric 500 MG CAPS Take 500 mg by mouth at bedtime.     vitamin k 100 MCG tablet Take 100 mcg by mouth at bedtime.     No current facility-administered medications for this visit.    Family History  Problem Relation Age of Onset   Cancer Mother        multiple myeloma   Cancer Father        oral   Diabetes Father    Diabetes Brother    Diabetes Brother    Heart disease Brother 3       secondary to drug use   Uterine cancer Maternal Grandmother    Diabetes Paternal Grandmother    Osteoporosis Other    Colon cancer Neg Hx     Review of Systems  All other systems reviewed and are negative.   Exam:   LMP 09/10/2003   Weight change: $RemoveBefore'@WEIGHTCHANGE'[id number]$ @ Height:      Ht Readings from Last 3 Encounters:  01/08/22 5' 1.4" (1.56 m)  12/18/21 5' 1.4" (1.56 m)  12/06/21 5' 1.42" (1.56 m)    General appearance: alert, cooperative and appears stated age Head: Normocephalic, without obvious abnormality, atraumatic Neck: no adenopathy, supple, symmetrical, trachea midline and thyroid normal to inspection and palpation Breasts: normal appearance, no masses or tenderness Abdomen: soft, non-tender; non distended,  no masses,  no organomegaly Extremities: extremities normal, atraumatic, no cyanosis or edema Skin: Skin color, texture, turgor normal. No rashes or  lesions Lymph nodes: Cervical, supraclavicular, and axillary nodes normal. No abnormal inguinal nodes palpated Neurologic: Grossly normal   Pelvic: External genitalia:  no lesions              Urethra:  normal appearing urethra with no masses, tenderness or lesions              Bartholins and Skenes: normal                 Vagina:  mildly atrophic, slightly shortened appearing vagina, no discharge, no lesions              Cervix: absent               Bimanual Exam:  Uterus:  uterus absent              Adnexa: no mass, fullness, tenderness               Rectovaginal: Confirms               Anus:  normal sphincter tone, no lesions  Gae Dry, CMA chaperoned for the exam.  1. Encounter for breast and pelvic examination Normal exam Mammogram and colonoscopy UTD  2. History of osteopenia Discussed calcium and vit d - DG Bone Density; Future  3. Hypoestrogenism - DG Bone Density; Future  4. Vaginal atrophy Using vaginal estrogen and vaginal dilators  5. History of endometrial cancer S/P surgery and radiation, doing well

## 2022-03-14 NOTE — Progress Notes (Signed)
Ms. Catherine Peterson presents today for follow-up after completing vaginal brachytherapy on 02/12/2022  Pain: Patient denies Abdominal bloating: Denies Diarrhea/Constipation: Denies any changes to bowel habits  Nausea/Vomiting:  Wt Readings from Last 3 Encounters:  03/15/22 117 lb 9.6 oz (53.3 kg)  01/08/22 118 lb 9.6 oz (53.8 kg)  01/01/22 117 lb (53.1 kg)   Vaginal Discharge: Denies (if she notices any discharge in her underwear it's usually after she's used the estradiol cream prescribed by Dr. Berline Lopes) Blood in Urine or Stool: Patient denies Urinary Issues: Patient denies Using vaginal dilators: Yes; using Intimate Rose brand, and reports she is able to tolerate 8 cm circumference GYN Onc appt: 06/14/2022 with Dr. Jeral Pinch  Other issues of note: Recently received a cortisone shot to her right posterior hip to alleviate sciatica pain. Had a nasal polyp removed by Dr. Constance Holster towards the end of June (reports she has healed and recovered well). Overall reports she's doing well, and please with her continued improvement/progress

## 2022-03-15 ENCOUNTER — Other Ambulatory Visit: Payer: Self-pay

## 2022-03-15 ENCOUNTER — Encounter: Payer: Self-pay | Admitting: Radiation Oncology

## 2022-03-15 ENCOUNTER — Ambulatory Visit
Admission: RE | Admit: 2022-03-15 | Discharge: 2022-03-15 | Disposition: A | Discharge status: Home / Self Care | Payer: Medicare PPO | Source: Ambulatory Visit | Attending: Radiation Oncology | Admitting: Radiation Oncology

## 2022-03-15 VITALS — BP 130/62 | HR 67 | Temp 97.5°F | Resp 20 | Wt 117.6 lb

## 2022-03-15 DIAGNOSIS — Z923 Personal history of irradiation: Secondary | ICD-10-CM | POA: Insufficient documentation

## 2022-03-15 DIAGNOSIS — Z7982 Long term (current) use of aspirin: Secondary | ICD-10-CM | POA: Insufficient documentation

## 2022-03-15 DIAGNOSIS — Z79899 Other long term (current) drug therapy: Secondary | ICD-10-CM | POA: Diagnosis not present

## 2022-03-15 DIAGNOSIS — C541 Malignant neoplasm of endometrium: Secondary | ICD-10-CM | POA: Insufficient documentation

## 2022-03-15 NOTE — Progress Notes (Signed)
Radiation Oncology         [phone]) [phone] ________________________________  Name: Catherine Peterson MRN: [id number]  Date: 03/15/2022  DOB: 05/20/1951  Follow-Up Visit Note  Outpatient  CC: Catherine Halim., PA-C  Catherine Halim., PA-C  Diagnosis and Prior Radiotherapy:    ICD-10-CM   1. Endometrial cancer ([place])  C54.1       Radiation Treatment Dates: 01/29/2022 through 02/12/2022 Site Technique Total Dose (Gy) Dose per Fx (Gy) Completed Fx Beam Energies  Vagina: Pelvis_vagina HDR-brachy 30/30 6 5/5 Ir-192   CHIEF COMPLAINT: Here for follow-up and surveillance of uterine cancer  Narrative:  The patient returns today for routine follow-up.  [name] presents today for follow-up after completing vaginal brachytherapy on 02/12/2022  Pain: Patient denies Abdominal bloating: Denies Diarrhea/Constipation: Denies any changes to bowel habits  Nausea/Vomiting:  Wt Readings from Last 3 Encounters:  03/15/22 117 lb 9.6 oz (53.3 kg)  01/08/22 118 lb 9.6 oz (53.8 kg)  01/01/22 117 lb (53.1 kg)   Vaginal Discharge: Denies (if she notices any discharge in her underwear it's usually after she's used the estradiol cream prescribed by Dr. Berline Lopes) Blood in Urine or Stool: Patient denies Urinary Issues: Patient denies Using vaginal dilators: Yes; using Intimate Rose brand, and reports she is able to tolerate 8 cm circumference GYN Onc appt: 06/14/2022 with Dr. Jeral Pinch  Other issues of note: Recently received a cortisone shot to her right posterior hip to alleviate sciatica pain. Had a nasal polyp removed by Dr. Constance Holster towards the end of June (reports she has healed and recovered well). Overall reports she's doing well, and please with her continued improvement/progress                              ALLERGIES:  is allergic to codeine and coumadin [warfarin].  Meds: Current Outpatient Medications  Medication Sig Dispense Refill   ACETAMINOPHEN-BUTALBITAL 50-325 MG TABS Take 1  tablet by mouth daily as needed (tension headache).     amphetamine-dextroamphetamine (ADDERALL XR) 30 MG 24 hr capsule Take 30 mg by mouth every morning.     ASPERCREME LIDOCAINE EX Apply 1 application topically daily as needed (pain).     aspirin EC 81 MG tablet Take 81 mg by mouth every morning. Swallow whole.     b complex vitamins capsule Take 1 capsule by mouth daily.     BIOTIN PO Take 1 capsule by mouth at bedtime.     bisacodyl (DULCOLAX) 5 MG EC tablet Take 5 mg by mouth daily as needed (constipation).     buPROPion (WELLBUTRIN XL) 300 MG 24 hr tablet Take 300 mg by mouth every morning.     cholecalciferol (VITAMIN D) 1000 UNITS tablet Take 2,000 Units by mouth at bedtime.     diclofenac Sodium (VOLTAREN) 1 % GEL Apply 1 application topically daily as needed (pain).     estradiol (ESTRACE VAGINAL) 0.1 MG/GM vaginal cream Place 1 Applicatorful vaginally 3 (three) times a week. 42.5 g 2   fluticasone (FLONASE) 50 MCG/ACT nasal spray Place 1 spray into both nostrils daily.     folic acid (FOLVITE) 073 MCG tablet Take 400 mcg by mouth daily.     Ginkgo Biloba 120 MG CAPS Take 120 mg by mouth at bedtime.     hydrochlorothiazide 25 MG tablet Take 25 mg by mouth every morning. Per patient for fluid retention not HTN     Ibuprofen 200  MG CAPS Take 400 mg by mouth 2 (two) times daily as needed (pain).     Melatonin 10 MG TABS Take 10 mg by mouth at bedtime.     Multiple Vitamin (MULTIVITAMIN WITH MINERALS) TABS tablet Take 1 tablet by mouth at bedtime.     Probiotic Product (PROBIOTIC PO) Take 1 tablet by mouth at bedtime.     thyroid (ARMOUR) 60 MG tablet Take 60 mg by mouth daily before breakfast.     Turmeric 500 MG CAPS Take 500 mg by mouth at bedtime.     vitamin k 100 MCG tablet Take 100 mcg by mouth at bedtime.     No current facility-administered medications for this encounter.    Physical Findings: The patient is in no acute distress. Patient is alert and oriented.  weight is  117 lb 9.6 oz (53.3 kg). Her temperature is 97.5 F (36.4 C) (abnormal). Her blood pressure is 130/62 and her pulse is 67. Her respiration is 20 and oxygen saturation is 100%. .    Gen: Well-appearing and in no acute distress GYN: Speculum exam reveals healthy vaginal mucosa without any bleeding nor any adhesions.  No evidence of dehiscence at the vaginal apex.  There is still narrowing at the apex.  No sign of recurrence.  Lab Findings: Lab Results  Component Value Date   WBC 5.9 11/06/2021   HGB 14.3 11/06/2021   HCT 41.4 11/06/2021   MCV 91.0 11/06/2021   PLT 269 11/06/2021    Radiographic Findings: No results found.  Impression/Plan: She is doing very well approximately 1 month after completing vaginal brachytherapy adjuvantly.  She knows to continue using her dilator at least 3 times a week.  She sees gynecologic oncology in October.  I will see her back for follow-up in January. She is pleased with this plan.  On date of service, in total, I spent 25 minutes on this encounter. Patient was seen in person.  _____________________________________   [name], MD

## 2022-03-21 ENCOUNTER — Ambulatory Visit (INDEPENDENT_AMBULATORY_CARE_PROVIDER_SITE_OTHER): Payer: Medicare PPO | Admitting: Obstetrics and Gynecology

## 2022-03-21 ENCOUNTER — Encounter: Payer: Self-pay | Admitting: Obstetrics and Gynecology

## 2022-03-21 VITALS — BP 110/74 | HR 67 | Ht 61.25 in | Wt 117.0 lb

## 2022-03-21 DIAGNOSIS — N952 Postmenopausal atrophic vaginitis: Secondary | ICD-10-CM | POA: Diagnosis not present

## 2022-03-21 DIAGNOSIS — Z9189 Other specified personal risk factors, not elsewhere classified: Secondary | ICD-10-CM

## 2022-03-21 DIAGNOSIS — E2839 Other primary ovarian failure: Secondary | ICD-10-CM

## 2022-03-21 DIAGNOSIS — Z8542 Personal history of malignant neoplasm of other parts of uterus: Secondary | ICD-10-CM | POA: Diagnosis not present

## 2022-03-21 DIAGNOSIS — Z9289 Personal history of other medical treatment: Secondary | ICD-10-CM | POA: Diagnosis not present

## 2022-03-21 DIAGNOSIS — Z8739 Personal history of other diseases of the musculoskeletal system and connective tissue: Secondary | ICD-10-CM | POA: Diagnosis not present

## 2022-03-21 DIAGNOSIS — Z01419 Encounter for gynecological examination (general) (routine) without abnormal findings: Secondary | ICD-10-CM

## 2022-03-21 NOTE — Patient Instructions (Signed)

## 2022-06-12 ENCOUNTER — Encounter: Payer: Self-pay | Admitting: Gynecologic Oncology

## 2022-06-14 ENCOUNTER — Encounter: Payer: Self-pay | Admitting: Gynecologic Oncology

## 2022-06-14 ENCOUNTER — Inpatient Hospital Stay: Payer: Medicare PPO | Attending: Gynecologic Oncology | Admitting: Gynecologic Oncology

## 2022-06-14 VITALS — BP 134/55 | HR 90 | Temp 98.1°F | Resp 18 | Ht 61.81 in | Wt 116.4 lb

## 2022-06-14 DIAGNOSIS — C541 Malignant neoplasm of endometrium: Secondary | ICD-10-CM

## 2022-06-14 DIAGNOSIS — Z8542 Personal history of malignant neoplasm of other parts of uterus: Secondary | ICD-10-CM | POA: Insufficient documentation

## 2022-06-14 DIAGNOSIS — Z90710 Acquired absence of both cervix and uterus: Secondary | ICD-10-CM | POA: Insufficient documentation

## 2022-06-14 DIAGNOSIS — Z90722 Acquired absence of ovaries, bilateral: Secondary | ICD-10-CM | POA: Diagnosis not present

## 2022-06-14 DIAGNOSIS — Z923 Personal history of irradiation: Secondary | ICD-10-CM | POA: Insufficient documentation

## 2022-06-14 NOTE — Progress Notes (Signed)
Gynecologic Oncology Return Clinic Visit  06/14/2022  Reason for Visit: Surveillance visit in the setting of early stage uterine cancer  Treatment History: Oncology History Overview Note  MMR IHC intact MS stable   Endometrial cancer ([place])  10/29/2021 Initial Biopsy   EMB: FIGO grade 2 endometrioid endometrial adenocarcinoma.   11/05/2021 Initial Diagnosis   Endometrial cancer ([place])   11/09/2021 Imaging   CT A/P: 1. Patient's known uterine/cervical cancer is poorly evaluated on CT. No evidence of metastatic disease within the abdomen or pelvis. 2. Homogeneous low-density 15 mm right ovarian cyst. No follow-up imaging recommended. Note: This recommendation does not apply to premenarchal patients and to those with increased risk (genetic, family history, elevated tumor markers or other high-risk factors) of ovarian cancer. Reference: JACR 2020 Feb; 17(2):248-254 3.  Aortic Atherosclerosis (ICD10-I70.0).   11/13/2021 Surgery   TRH/BSO, SLN biopsy, repair of posterior vaginal laceration  Findings:  On EUA, small mobile uterus. On intra-abdominal entry, normal upper abdominal survey. Normal omentum, small and large bowel. Uterus 6-8 cm, normal in appearance. Normal appearing bilateral adnexa. No obvious adenopathy; mapping successful to bilateral obturator SLNs. No pelvic or abdominal evidence of disease.    11/13/2021 Pathology Results   A. LYMPH NODE, SENTINEL, RIGHT OBTURATOR, EXCISION:  - No carcinoma identified in 1 lymph node (0/1).   B. LYMPH NODE, SENTINEL, RIGHT OBTURATOR #2, EXCISION:  - No carcinoma identified in 1 lymph node (0/1).   C. LYMPH NODE, SENTINEL, LEFT OBTURATOR, EXCISION:  - No carcinoma identified in 1 lymph node (0/1).   D. 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  - Tumor invades less than half of of myometrial  thickness.  - Cervix: Unremarkable.  No atypia, dysplasia, or malignancy.  Endometrial carcinoma does not involve cervix.  - Bilateral fallopian tubes: Unremarkable segments of fimbriated  fallopian tube.  No malignancy.  - Right ovary: Benign cyst.  No malignancy.  - Left ovary: Benign cysts.  No malignancy.   ONCOLOGY TABLE:   UTERUS, CARCINOMA OR CARCINOSARCOMA: Resection   Procedure: Total hysterectomy and bilateral salpingo-oophorectomy  Histologic Type: Endometrioid adenocarcinoma  Histologic Grade: 2  Myometrial Invasion:       Depth of Myometrial Invasion (mm): 1 mm       Myometrial Thickness (mm): 11 mm       Percentage of Myometrial Invasion: 9%  Uterine Serosa Involvement: Not identified  Cervical stromal Involvement: Not identified  Extent of involvement of other tissue/organs: Not identified  Peritoneal/Ascitic Fluid: Not applicable  Lymphovascular Invasion: Not identified  Regional Lymph Nodes: Present.  No carcinoma identified in any of 3  lymph nodes (0/3).       Pelvic Lymph Nodes Examined:                                   Sentinel: 3                                   Non-sentinel: 0                                   Total :3       Pelvic Lymph Nodes with  Metastasis: 0                           Macrometastasis: (>2.0 mm): 0                          Micrometastasis: (>0.2 mm and < 2.0 mm): 0                            Isolated Tumor Cells (<0.2 mm): 0                       Laterality of Lymph Node with Tumor: Not applicable                             Extracapsular Extension: Not applicable       Para-aortic Lymph Nodes Examined: 0  Distant Metastasis:       Distant Site(s) Involved: Not applicable  Pathologic Stage Classification (pTNM, AJCC 8th Edition): pT1a pN 0  Ancillary Studies: MMR / MSI testing will be ordered  Representative Tumor Block: D6  Comment(s):  Pancytokeratin was performed on the lymph nodes and is  negative.    12/18/2021 Cancer  Staging   Staging form: Corpus Uteri - Carcinoma and Carcinosarcoma, AJCC 8th Edition - Pathologic stage from 12/18/2021: FIGO Stage IA (pT1a, pN0, cM0) - Signed by [name], MD on 12/18/2021 Stage prefix: Initial diagnosis Histologic grade (G): G2 Histologic grading system: 3 grade system   01/29/2022 - 02/12/2022 Radiation Therapy   01/29/2022 through 02/12/2022 Site Technique Total Dose (Gy) Dose per Fx (Gy) Completed Fx Beam Energies  Vagina: Pelvis_vagina HDR-brachy 30/30 6 5/5 Ir-192       Interval History: Doing well.  Denies any vaginal bleeding or discharge.  Has been using her vaginal dilator 2-3 times a week.  Reports normal bowel and bladder function.  Has intermittent pressure when she needs to defecate, at baseline.  Has some low back pain related to degenerative changes.  Has been doing pelvic floor exercises (Kegels).  Continues to use vaginal estrogen.  Had a couple of trips to [place] over the summer.  Past Medical/Surgical History: Past Medical History:  Diagnosis Date   ADHD (attention deficit hyperactivity disorder)    Anxiety    Arthritis    in thumbs. more in rt thumb   Basal cell carcinoma    Leg and back of R arm    Depression    Hyperlipidemia    Hypokalemia    Hypothyroidism    Lower leg edema    PMB (postmenopausal bleeding) 01/2002   endo polyp   Squamous cell skin cancer    Thyroid disease 2002   hypothyroidism    Past Surgical History:  Procedure Laterality Date   CHOLECYSTECTOMY, LAPAROSCOPIC  06/07/2011   HAMMERTOE RECONSTRUCTION WITH WEIL OSTEOTOMY Right 09/25/2017   Procedure: Right 2-4 metatarsal Weil osteotomies and hammertoe corrections;  Surgeon: Wylene Simmer, MD;  Location: [facility];  Service: Orthopedics;  Laterality: Right;   HAND SURGERY Right    HYSTEROSCOPY  01/07/2002   endo polyp and focal simple hyperplasia   LEFT THUMB SURGERY      POPLITEAL SYNOVIAL CYST EXCISION Left 2008 and 2010   ROBOTIC ASSISTED  TOTAL HYSTERECTOMY WITH BILATERAL SALPINGO OOPHERECTOMY Bilateral 11/13/2021   Procedure: XI ROBOTIC ASSISTED TOTAL HYSTERECTOMY WITH  BILATERAL SALPINGO,OOPHORECTOMY;  Surgeon: [name], MD;  Location: [facility];  Service: Gynecology;  Laterality: Bilateral;   SENTINEL NODE BIOPSY N/A 11/13/2021   Procedure: SENTINEL NODE BIOPSY;  Surgeon: [name], MD;  Location: [facility];  Service: Gynecology;  Laterality: N/A;   TONSILLECTOMY      Family History  Problem Relation Age of Onset   Cancer Mother        multiple myeloma   Cancer Father        oral   Diabetes Father    Diabetes Brother    Prostate cancer Brother    Diabetes Brother    Heart disease Brother 77       secondary to drug use   Uterine cancer Maternal Grandmother    Diabetes Paternal Grandmother    Osteoporosis Other    Colon cancer Neg Hx    Breast cancer Neg Hx    Ovarian cancer Neg Hx    Endometrial cancer Neg Hx    Pancreatic cancer Neg Hx     Social History   Socioeconomic History   Marital status: Widowed    Spouse name: Not on file   Number of children: 2   Years of education: Not on file   Highest education level: Master's degree (e.g., MA, MS, MEng, MEd, MSW, MBA)  Occupational History   Occupation: retired  Tobacco Use   Smoking status: Never   Smokeless tobacco: Never  Vaping Use   Vaping Use: Never used  Substance and Sexual Activity   Alcohol use: No   Drug use: No   Sexual activity: Not Currently    Partners: Male    Birth control/protection: Post-menopausal, Surgical  Other Topics Concern   Not on file  Social History Narrative   Husband died 11-23-2006. Not working outside the home.   Social Determinants of Health   Financial Resource Strain: Not on file  Food Insecurity: Not on file  Transportation Needs: Not on file  Physical Activity: Not on file  Stress: Not on file  Social Connections: Not on file    Current Medications:  Current Outpatient Medications:     ACETAMINOPHEN-BUTALBITAL 50-325 MG TABS, Take 1 tablet by mouth daily as needed (tension headache)., Disp: , Rfl:    amphetamine-dextroamphetamine (ADDERALL XR) 30 MG 24 hr capsule, Take 30 mg by mouth every morning., Disp: , Rfl:    ASPERCREME LIDOCAINE EX, Apply 1 application topically daily as needed (pain)., Disp: , Rfl:    aspirin EC 81 MG tablet, Take 81 mg by mouth every morning. Swallow whole., Disp: , Rfl:    b complex vitamins capsule, Take 1 capsule by mouth daily., Disp: , Rfl:    BIOTIN PO, Take 1 capsule by mouth at bedtime., Disp: , Rfl:    bisacodyl (DULCOLAX) 5 MG EC tablet, Take 5 mg by mouth daily as needed (constipation)., Disp: , Rfl:    buPROPion (WELLBUTRIN XL) 300 MG 24 hr tablet, Take 300 mg by mouth every morning., Disp: , Rfl:    cholecalciferol (VITAMIN D) 1000 UNITS tablet, Take 2,000 Units by mouth at bedtime., Disp: , Rfl:    diclofenac Sodium (VOLTAREN) 1 % GEL, Apply 1 application topically daily as needed (pain)., Disp: , Rfl:    estradiol (ESTRACE VAGINAL) 0.1 MG/GM vaginal cream, Place 1 Applicatorful vaginally 3 (three) times a week., Disp: 16.1 g, Rfl: 2   folic acid (FOLVITE) 096 MCG tablet, Take 400 mcg by mouth daily., Disp: , Rfl:  Ginkgo Biloba 120 MG CAPS, Take 120 mg by mouth at bedtime., Disp: , Rfl:    hydrochlorothiazide 25 MG tablet, Take 25 mg by mouth every morning. Per patient for fluid retention not HTN, Disp: , Rfl:    Ibuprofen 200 MG CAPS, Take 400 mg by mouth 2 (two) times daily as needed (pain)., Disp: , Rfl:    magnesium 30 MG tablet, Take 30 mg by mouth 2 (two) times daily., Disp: , Rfl:    Melatonin 10 MG TABS, Take 10 mg by mouth at bedtime., Disp: , Rfl:    Multiple Vitamin (MULTIVITAMIN WITH MINERALS) TABS tablet, Take 1 tablet by mouth at bedtime., Disp: , Rfl:    Probiotic Product (PROBIOTIC PO), Take 1 tablet by mouth at bedtime., Disp: , Rfl:    thyroid (ARMOUR) 60 MG tablet, Take 60 mg by mouth daily before breakfast., Disp: ,  Rfl:    Turmeric 500 MG CAPS, Take 500 mg by mouth at bedtime., Disp: , Rfl:    zinc gluconate 50 MG tablet, Take 50 mg by mouth daily., Disp: , Rfl:   Review of Systems: + Fatigue, ringing in the ears, slight leg swelling, intermittent abdominal pain, back pain, anxiety Denies appetite changes, fevers, chills, unexplained weight changes. Denies hearing loss, neck lumps or masses, mouth sores or voice changes. Denies cough or wheezing.  Denies shortness of breath. Denies chest pain or palpitations.  Denies abdominal distention, blood in stools, constipation, diarrhea, nausea, vomiting, or early satiety. Denies pain with intercourse, dysuria, frequency, hematuria or incontinence. Denies hot flashes, pelvic pain, vaginal bleeding or vaginal discharge.   Denies joint pain or muscle pain/cramps. Denies itching, rash, or wounds. Denies dizziness, headaches, numbness or seizures. Denies swollen lymph nodes or glands, denies easy bruising or bleeding. Denies depression, confusion, or decreased concentration.  Physical Exam: BP (!) 134/55 (BP Location: Right Arm, Patient Position: Sitting)   Pulse 90   Temp 98.1 F (36.7 C) (Oral)   Resp 18   Ht 5' 1.81" (1.57 m)   Wt 116 lb 6.4 oz (52.8 kg)   LMP 09/10/2003   SpO2 98%   BMI 21.42 kg/m  General: Alert, oriented, no acute distress. HEENT: Normocephalic, atraumatic, sclera anicteric. Chest: Clear to auscultation bilaterally.  No wheezes or rhonchi. Cardiovascular: Regular rate and rhythm, no murmurs. Abdomen: soft, nontender.  Normoactive bowel sounds.  No masses or hepatosplenomegaly appreciated.  Well-healed incisions. Extremities: Grossly normal range of motion.  Warm, well perfused.  No edema bilaterally. Skin: No rashes or lesions noted. Lymphatics: No cervical, supraclavicular, or inguinal adenopathy. GU: Normal appearing external genitalia without erythema, excoriation, or lesions.  Speculum exam reveals moderately atrophic  vaginal apex with some mild narrowing just of the apex.  Otherwise, mildly atrophic vaginal mucosa, no lesions noted.  Bimanual exam reveals smooth mucosa, no masses or nodularity.  Rectovaginal exam confirms these findings.  Laboratory & Radiologic Studies: None new  Assessment & Plan: Catherine Peterson is a 71 y.o. 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  MMR intact, MSS.  Patient is overall doing well.  She is NED on exam today.  Per NCCN surveillance recommendations, we discussed alternating visits every 3 months between my office and her radiation oncologist.  Scheduled to see Dr. Isidore Moos in January.  I have asked her to call my office after the new year to schedule a visit to see me in April.  We reviewed signs and symptoms that would  be concerning for cancer recurrence, and I stressed the importance of calling if she develops any of these.  20 minutes of total time was spent for this patient encounter, including preparation, face-to-face counseling with the patient and coordination of care, and documentation of the encounter.  Jeral Pinch, MD  Division of Gynecologic Oncology  Department of Obstetrics and Gynecology  [facility]

## 2022-06-14 NOTE — Patient Instructions (Signed)
It was good to see you today.  I do not see or feel any evidence of cancer recurrence on your exam.  I will see you for follow-up in 6 months.  Please call after the new year to schedule a visit to see me in April.  As always, if you develop any new and concerning symptoms before your next visit, please call to see me sooner.

## 2022-06-26 IMAGING — MG MM DIGITAL SCREENING BILAT W/ TOMO AND CAD
8 series · 8 of 24 positions shown · non-contrast
Comparison: Previous exam(s).

CLINICAL DATA: Screening.

EXAM:
DIGITAL SCREENING BILATERAL MAMMOGRAM WITH TOMOSYNTHESIS AND CAD
TECHNIQUE: Bilateral screening digital craniocaudal and mediolateral oblique
mammograms were obtained. Bilateral screening digital breast
tomosynthesis was performed. The images were evaluated with
computer-aided detection.

[R CC synth-2D]
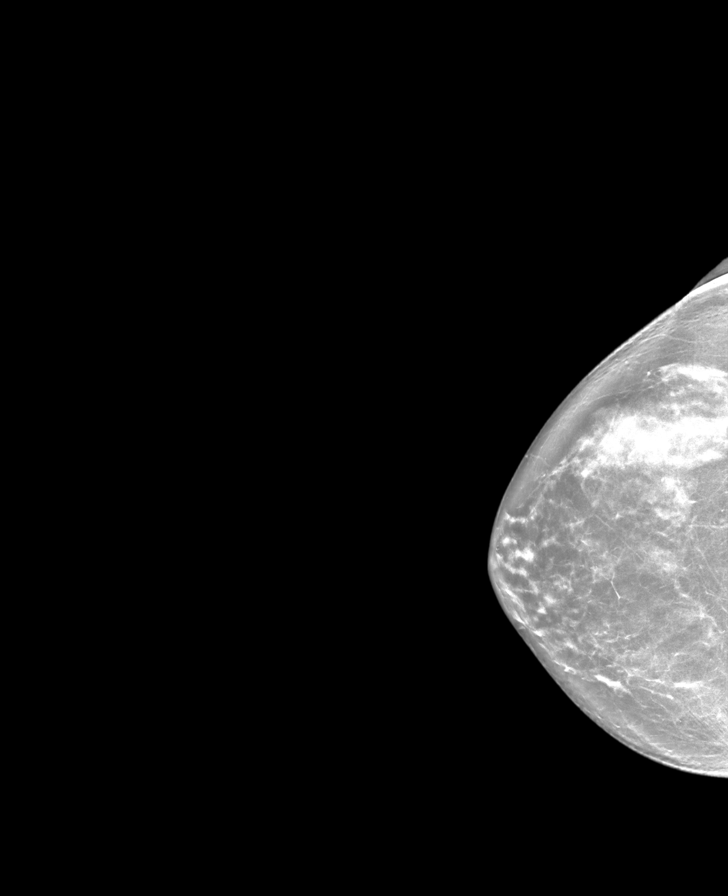

[L MLO synth-2D]
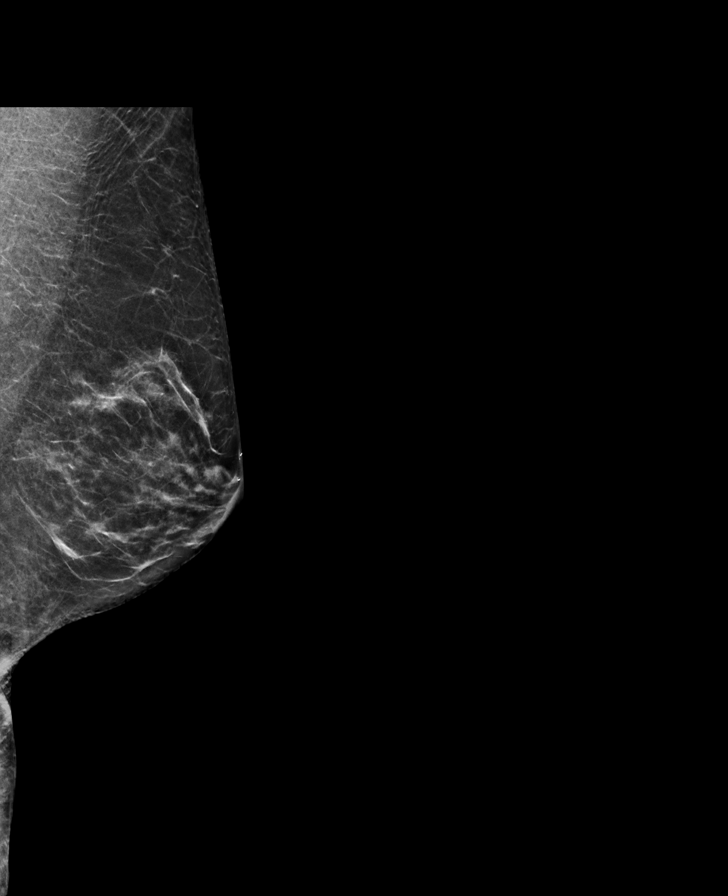

[R MLO synth-2D]
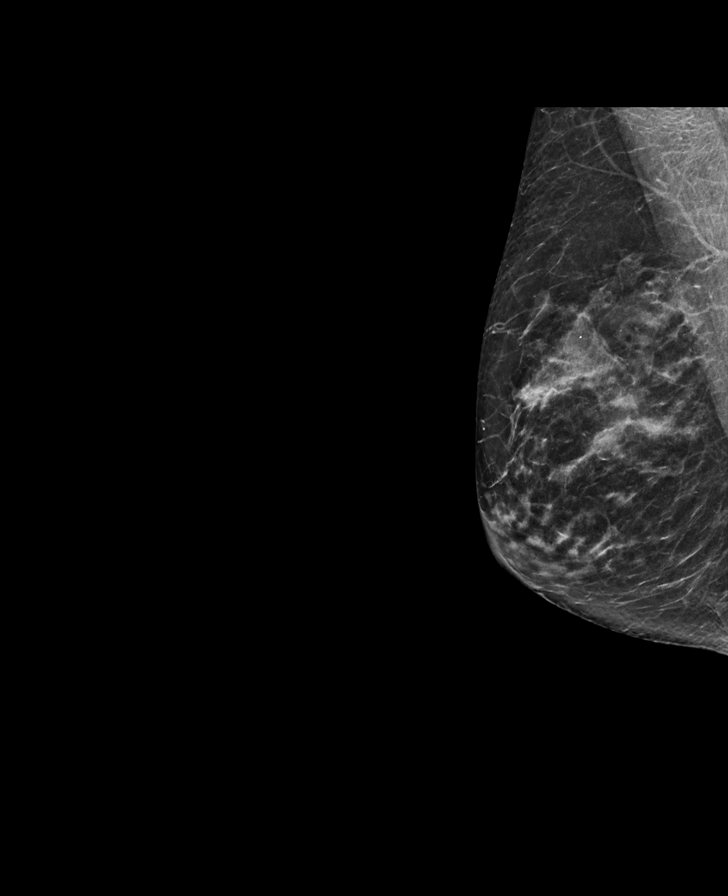

[L CC synth-2D]
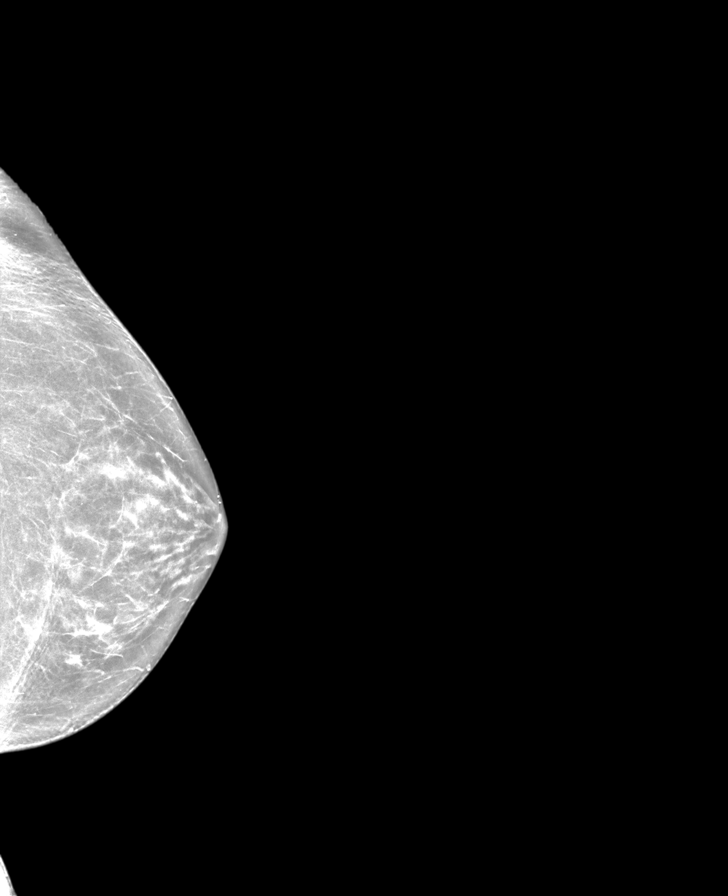

[R CC tomo · tomo slice 37/72.0]
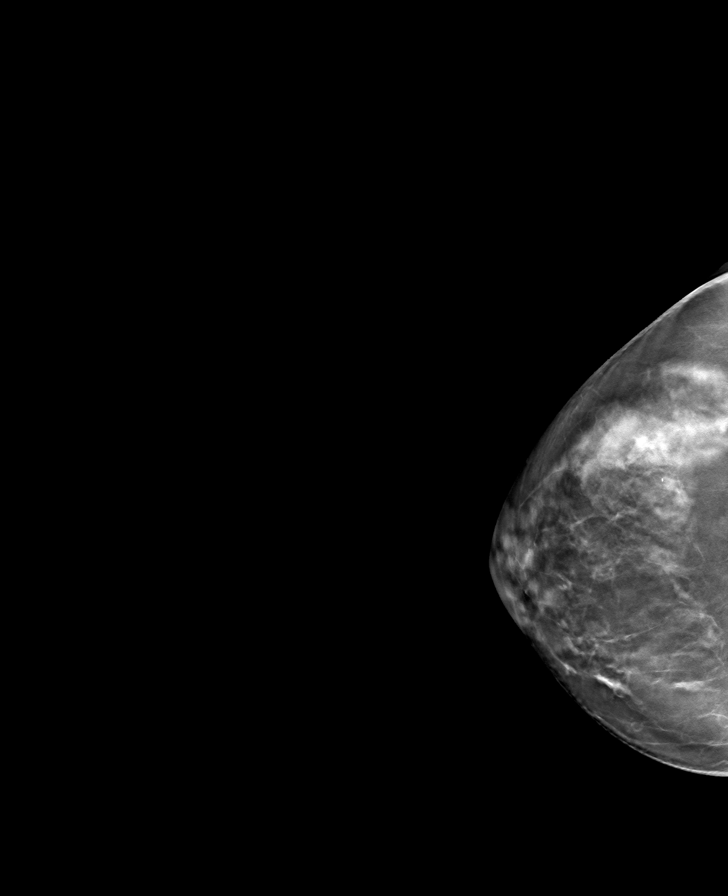

[L CC tomo · tomo slice 33/64.0]
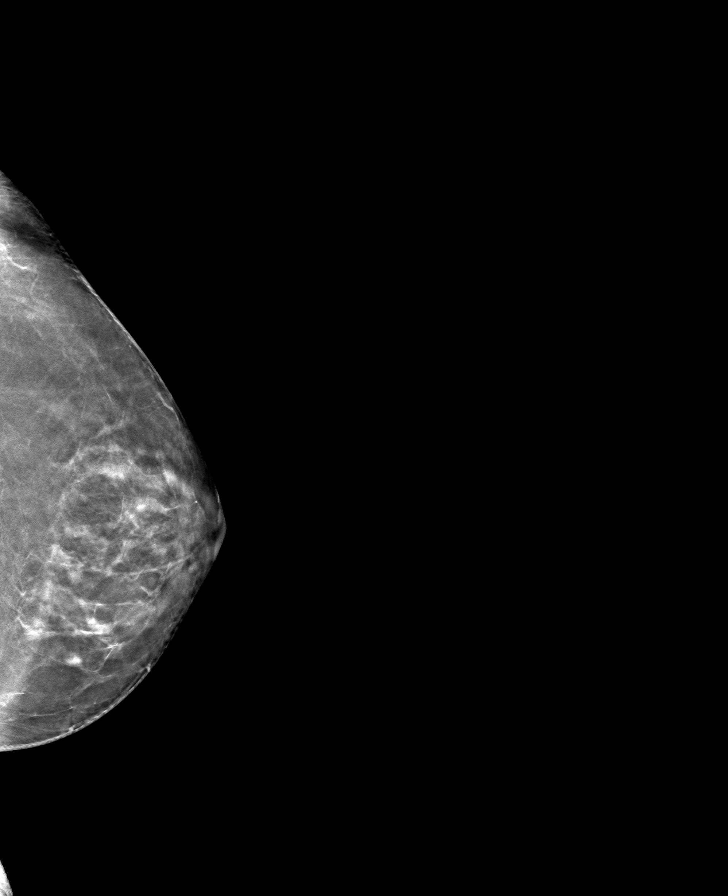

[R MLO tomo · tomo slice 30/59.0]
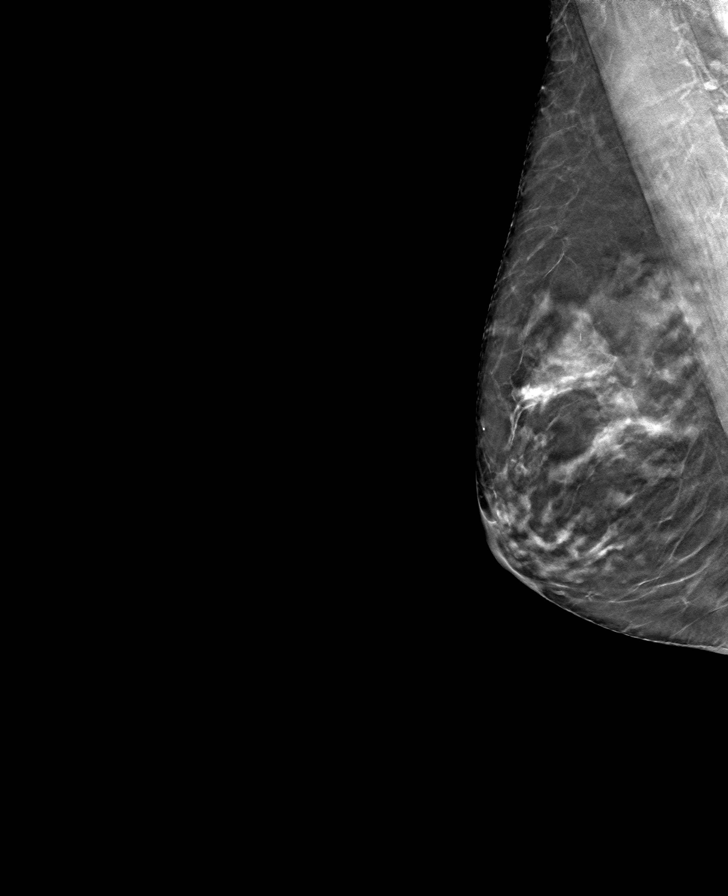

[L MLO tomo · tomo slice 31/60.0]
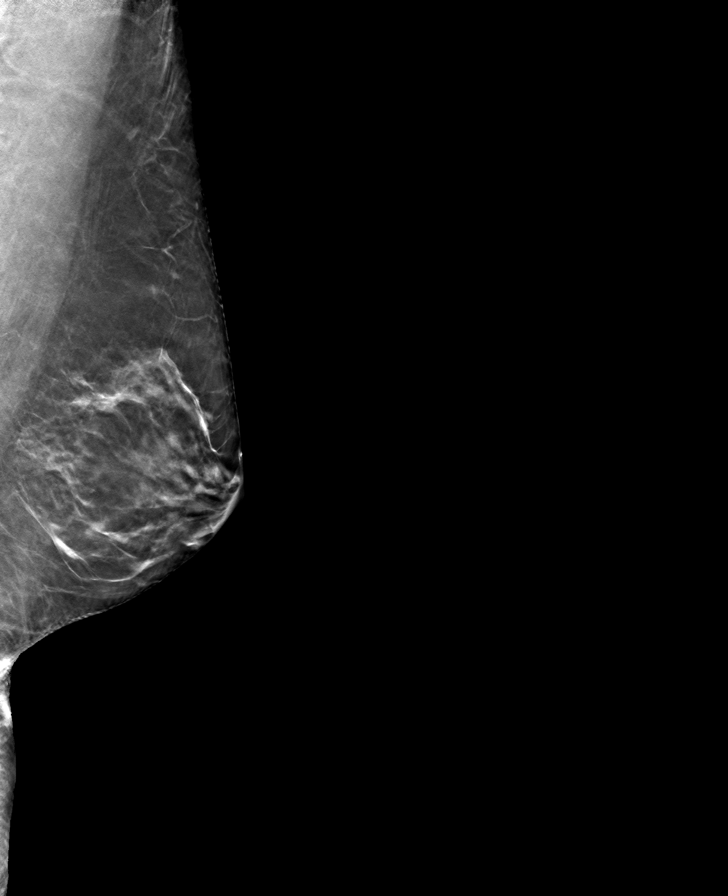

[8 of 24 positions shown; findings below may reference images not displayed]

ACR Breast Density Category c: The breast tissue is heterogeneously
dense, which may obscure small masses.
FINDINGS: There are no findings suspicious for malignancy.
IMPRESSION: No mammographic evidence of malignancy. A result letter of this
screening mammogram will be mailed directly to the patient.

RECOMMENDATION:
Screening mammogram in one year. (Code:Q3-[id number])

BI-RADS CATEGORY  1: Negative.

## 2022-07-09 ENCOUNTER — Other Ambulatory Visit: Payer: Self-pay | Admitting: Family Medicine

## 2022-07-09 DIAGNOSIS — Z1231 Encounter for screening mammogram for malignant neoplasm of breast: Secondary | ICD-10-CM

## 2022-07-12 ENCOUNTER — Other Ambulatory Visit: Payer: Self-pay | Admitting: Family Medicine

## 2022-07-12 DIAGNOSIS — E2839 Other primary ovarian failure: Secondary | ICD-10-CM

## 2022-07-15 ENCOUNTER — Ambulatory Visit
Admission: RE | Admit: 2022-07-15 | Discharge: 2022-07-15 | Disposition: A | Discharge status: Home / Self Care | Payer: Medicare PPO | Source: Ambulatory Visit | Attending: Family Medicine | Admitting: Family Medicine

## 2022-07-15 DIAGNOSIS — Z1231 Encounter for screening mammogram for malignant neoplasm of breast: Secondary | ICD-10-CM

## 2022-09-17 NOTE — Progress Notes (Signed)
Catherine Peterson presents today for follow-up after completing vaginal brachytherapy on 02/12/2022.   She is currently in no pain.  Patient complains of None. Reports none None.    Patient states they urinate 0 - 1 times per night.   Patient reports None, a bowel movement everyday.   Patient reports None.    Patient is using their vaginal dilator. Using 2-3 times per week.   LMP 09/10/2003   Vitals:   09/25/22 1033  BP: 129/63  Pulse: 96  Resp: 18  Temp: 97.7 F (36.5 C)  SpO2: 100%    No major concerns or questions. Saw OBGyn in July with a clean report.

## 2022-09-25 ENCOUNTER — Encounter: Payer: Self-pay | Admitting: Radiation Oncology

## 2022-09-25 ENCOUNTER — Ambulatory Visit
Admission: RE | Admit: 2022-09-25 | Discharge: 2022-09-25 | Disposition: A | Discharge status: Home / Self Care | Payer: Medicare PPO | Source: Ambulatory Visit | Attending: Radiation Oncology | Admitting: Radiation Oncology

## 2022-09-25 VITALS — BP 129/63 | HR 96 | Temp 97.7°F | Resp 18 | Ht 61.8 in | Wt 115.2 lb

## 2022-09-25 DIAGNOSIS — C541 Malignant neoplasm of endometrium: Secondary | ICD-10-CM | POA: Diagnosis present

## 2022-09-25 DIAGNOSIS — Z7982 Long term (current) use of aspirin: Secondary | ICD-10-CM | POA: Insufficient documentation

## 2022-09-25 DIAGNOSIS — Z79899 Other long term (current) drug therapy: Secondary | ICD-10-CM | POA: Insufficient documentation

## 2022-09-25 NOTE — Progress Notes (Signed)
Radiation Oncology         [phone]) [phone] ________________________________  Name: Catherine Peterson MRN: [id number]  Date: 09/25/2022  DOB: 10-13-1950  Follow-Up Visit Note  Outpatient  CC: Catherine Halim., PA-C  Catherine Halim., PA-C  Diagnosis and Prior Radiotherapy:    ICD-10-CM   1. Endometrial cancer ([place])  C54.1      Cancer Staging  Endometrial cancer [facility]) Staging form: Corpus Uteri - Carcinoma and Carcinosarcoma, AJCC 8th Edition - Pathologic stage from 12/18/2021: FIGO Stage IA (pT1a, pN0, cM0) - Signed by [name], MD on 12/18/2021 Stage prefix: Initial diagnosis Histologic grade (G): G2 Histologic grading system: 3 grade system  Radiation Treatment Dates: 01/29/2022 through 02/12/2022 Site Technique Total Dose (Gy) Dose per Fx (Gy) Completed Fx Beam Energies  Vagina: Pelvis_vagina HDR-brachy 30/30 6 5/5 Ir-192   CHIEF COMPLAINT: Here for follow-up and surveillance of uterine cancer  Narrative:  [name] presents today for follow-up after completing vaginal brachytherapy on 02/12/2022.   She is currently in no pain.  Patient complains of None. Reports none None.    Patient states they urinate 0 - 1 times per night.   Patient reports None, a bowel movement everyday.   Patient reports None.    Patient is using their vaginal dilator. Using 2-3 times per week.   BP 129/63 (BP Location: Right Arm, Patient Position: Sitting, Cuff Size: Normal)   Pulse 96   Temp 97.7 F (36.5 C)   Resp 18   Ht 5' 1.8" (1.57 m)   Wt 115 lb 3.2 oz (52.3 kg)   LMP 09/10/2003   SpO2 100%   BMI 21.21 kg/m   Vitals:   09/25/22 1033  BP: 129/63  Pulse: 96  Resp: 18  Temp: 97.7 F (36.5 C)  SpO2: 100%   No major concerns or questions. Saw OBGyn in Oct with a clean report.                  ALLERGIES:  is allergic to codeine and coumadin [warfarin].  Meds: Current Outpatient Medications  Medication Sig Dispense Refill   ACETAMINOPHEN-BUTALBITAL 50-325 MG TABS Take  1 tablet by mouth daily as needed (tension headache).     amphetamine-dextroamphetamine (ADDERALL XR) 30 MG 24 hr capsule Take 30 mg by mouth every morning.     ASPERCREME LIDOCAINE EX Apply 1 application topically daily as needed (pain).     aspirin EC 81 MG tablet Take 81 mg by mouth every morning. Swallow whole.     b complex vitamins capsule Take 1 capsule by mouth daily.     BIOTIN PO Take 1 capsule by mouth at bedtime.     bisacodyl (DULCOLAX) 5 MG EC tablet Take 5 mg by mouth daily as needed (constipation).     buPROPion (WELLBUTRIN XL) 300 MG 24 hr tablet Take 300 mg by mouth every morning.     cholecalciferol (VITAMIN D) 1000 UNITS tablet Take 2,000 Units by mouth at bedtime.     diclofenac Sodium (VOLTAREN) 1 % GEL Apply 1 application topically daily as needed (pain).     estradiol (ESTRACE VAGINAL) 0.1 MG/GM vaginal cream Place 1 Applicatorful vaginally 3 (three) times a week. 97.3 g 2   folic acid (FOLVITE) 532 MCG tablet Take 400 mcg by mouth daily.     Ginkgo Biloba 120 MG CAPS Take 120 mg by mouth at bedtime.     hydrochlorothiazide 25 MG tablet Take 25 mg by mouth every morning. Per patient for  fluid retention not HTN     Ibuprofen 200 MG CAPS Take 400 mg by mouth 2 (two) times daily as needed (pain).     magnesium 30 MG tablet Take 30 mg by mouth 2 (two) times daily.     Melatonin 10 MG TABS Take 10 mg by mouth at bedtime.     Multiple Vitamin (MULTIVITAMIN WITH MINERALS) TABS tablet Take 1 tablet by mouth at bedtime.     Probiotic Product (PROBIOTIC PO) Take 1 tablet by mouth at bedtime.     thyroid (ARMOUR) 60 MG tablet Take 60 mg by mouth daily before breakfast.     Turmeric 500 MG CAPS Take 500 mg by mouth at bedtime.     zinc gluconate 50 MG tablet Take 50 mg by mouth daily.     No current facility-administered medications for this encounter.    Physical Findings: The patient is in no acute distress. Patient is alert and oriented.  height is 5' 1.8" (1.57 m) and  weight is 115 lb 3.2 oz (52.3 kg). Her temperature is 97.7 F (36.5 C). Her blood pressure is 129/63 and her pulse is 96. Her respiration is 18 and oxygen saturation is 100%. .    Gen: Well-appearing and in no acute distress GYN: Speculum exam some submucosal bleeding at dorsal apex of vagina.  No evidence of dehiscence at the vaginal apex.  There is still narrowing at the apex.  No sign of nodularity. See photo    Lab Findings: Lab Results  Component Value Date   WBC 5.9 11/06/2021   HGB 14.3 11/06/2021   HCT 41.4 11/06/2021   MCV 91.0 11/06/2021   PLT 269 11/06/2021   Radiographic Findings: No results found.  Impression/Plan:  She is doing very well symptomatically after completing vaginal brachytherapy adjuvantly.  There is some submucosal bleeding that I suspect is from mild trauma, either from recent dilator use or today's exam.  I will share her photo w/ the GYN team as a precaution but I did not think this is a sign of recurrent disease.  She knows to continue using her dilator at least 3 times a week.  She sees gynecologic oncology in 32mo sooner prn.  I will see her back for follow-up in 637moShe is pleased with this plan.  On date of service, in total, I spent 30 minutes on this encounter. Patient was seen in person.  _____________________________________   SaEppie GibsonMD

## 2022-09-27 ENCOUNTER — Telehealth: Payer: Self-pay | Admitting: *Deleted

## 2022-09-27 NOTE — Telephone Encounter (Signed)
Patient notified of response from Dr Berline Lopes.  Patient will call for appointment if any further bleeding.

## 2022-09-27 NOTE — Telephone Encounter (Signed)
Call to patient to follow-up on vaginal spotting. Patient reports no spotting or bleeding.  Denies pain. Has been able to use vaginal dilator without any spotting.  Aware to call back to office if spotting recurs.

## 2022-09-27 NOTE — Telephone Encounter (Signed)
That sounds good. Please let her know that we are happy to see her (she could have an exam with Melissa). Without examining her (but seeing picture from recent visit) I suspect bleeding was from exam and some atrophic/radiation changes of the tissue.

## 2022-11-01 IMAGING — CT CT ABD-PELV W/ CM
2 of 5 series · 16 of 46 positions shown, 18 images · IV contrast (APPLIED)
Comparison: September 23, 2017

CLINICAL DATA: Uterine/cervical cancer, staging.

EXAM:
CT ABDOMEN AND PELVIS WITH CONTRAST
TECHNIQUE: Multidetector CT imaging of the abdomen and pelvis was performed
using the standard protocol following bolus administration of
intravenous contrast.

[Series 2: axial st · axial · 0.84mm/px · z∈[-457,-97]mm · 13 of 84 slices shown, 15 images]
[im 6/84  soft-tissue]
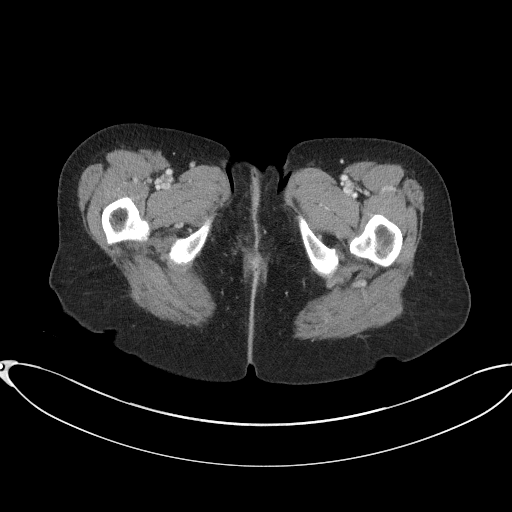
[im 6/84  bone]
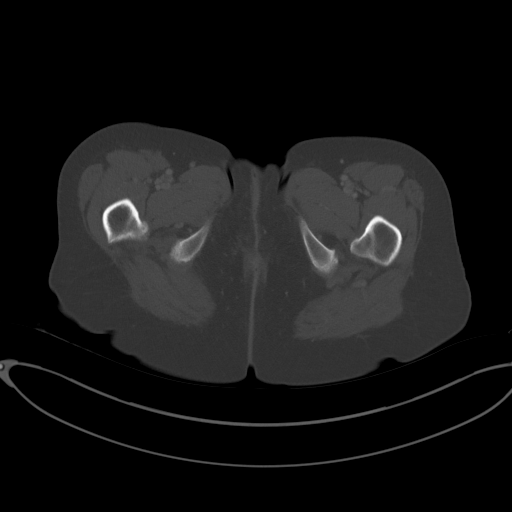
[im 12/84  soft-tissue]
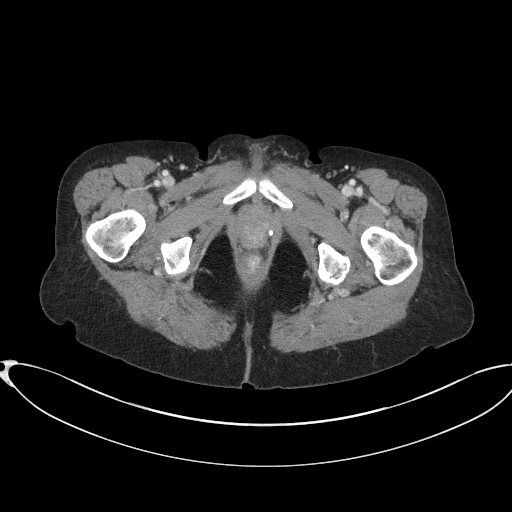
[im 17/84  soft-tissue]
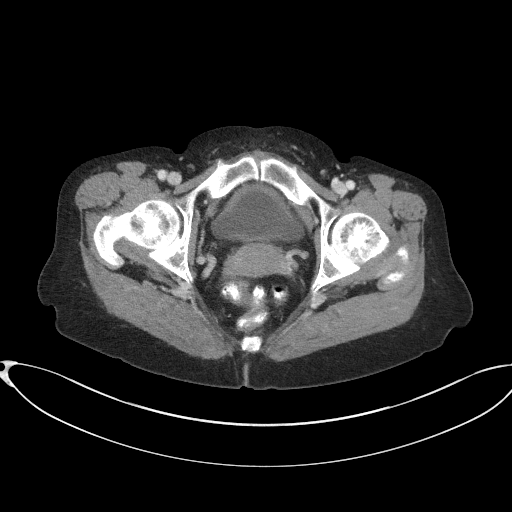
[im 23/84  soft-tissue]
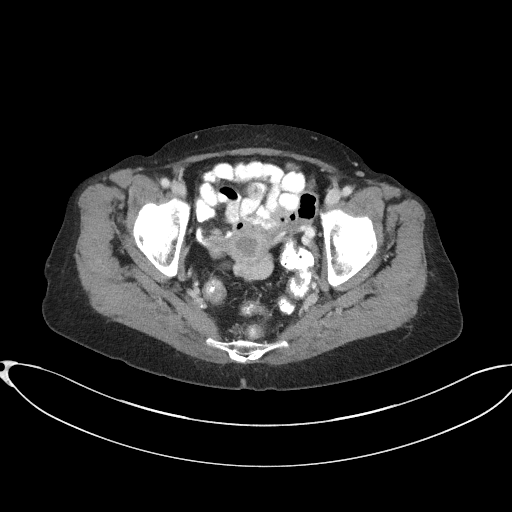
[im 28/84  soft-tissue]
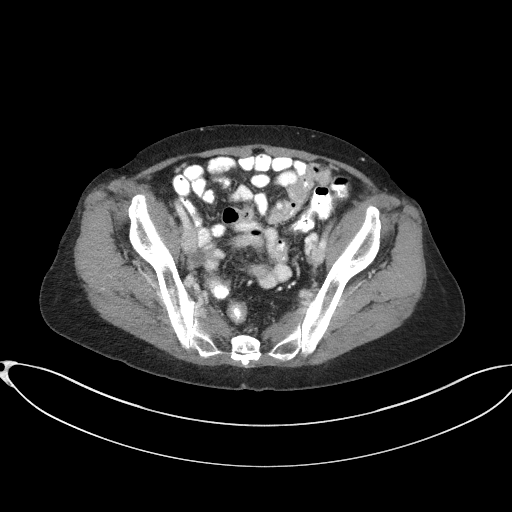
[im 34/84  soft-tissue]
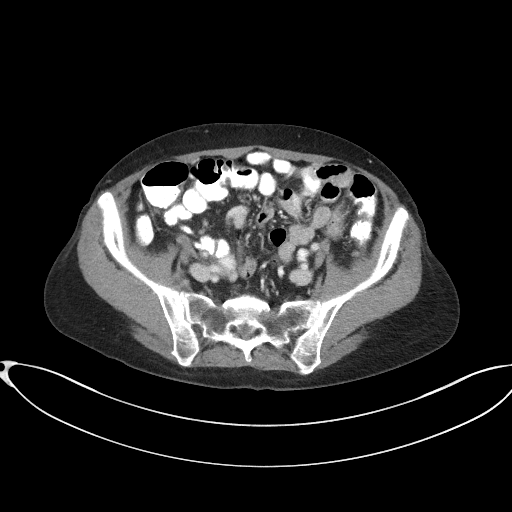
[im 45/84  soft-tissue]
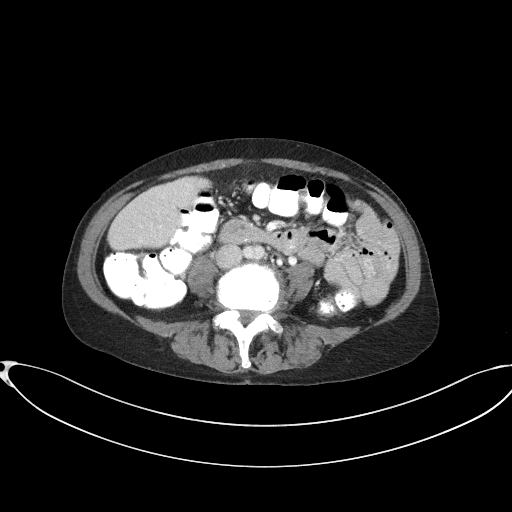
[im 50/84  soft-tissue]
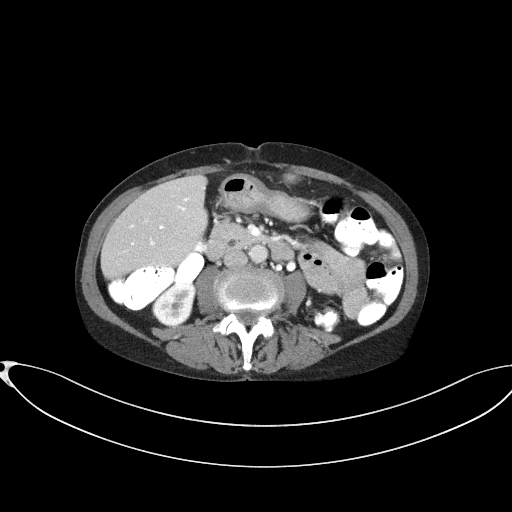
[im 56/84  soft-tissue]
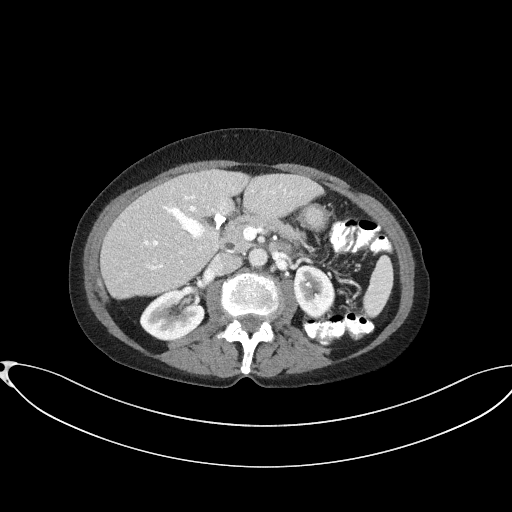
[im 56/84  bone]
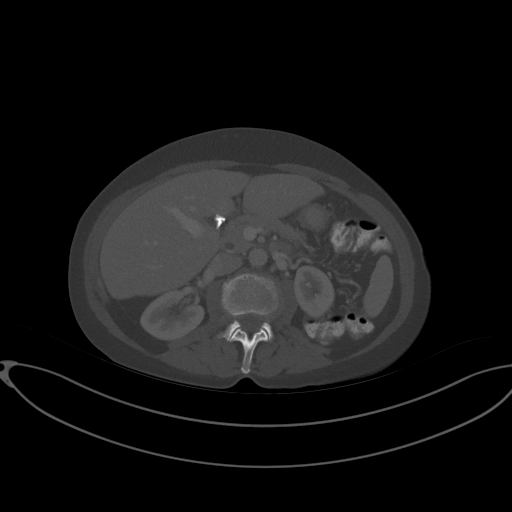
[im 61/84  soft-tissue]
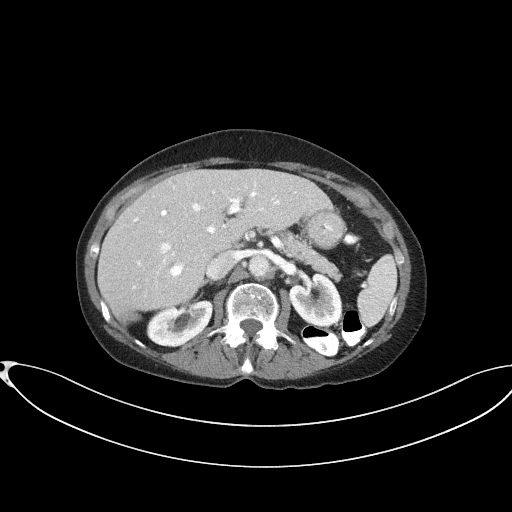
[im 67/84  soft-tissue]
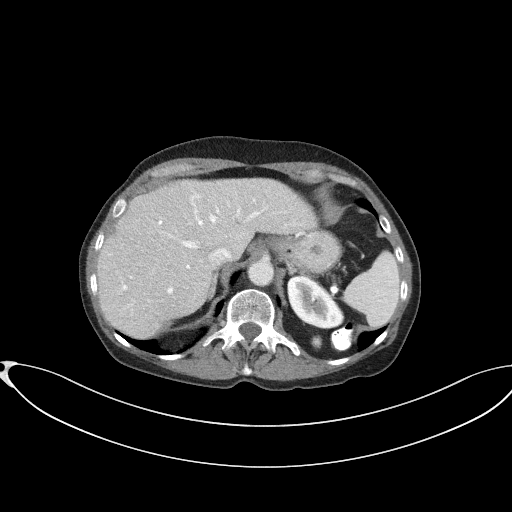
[im 72/84  soft-tissue]
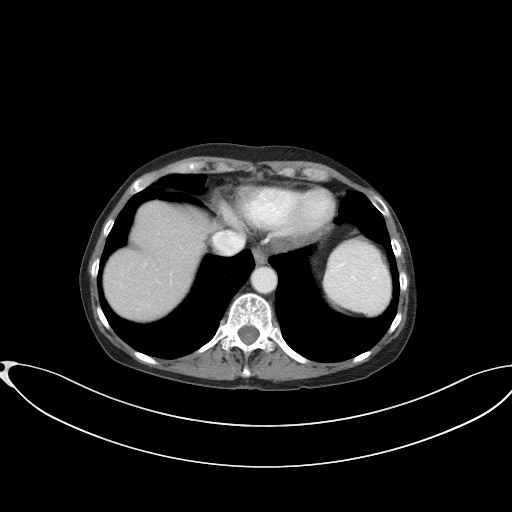
[im 78/84  soft-tissue]
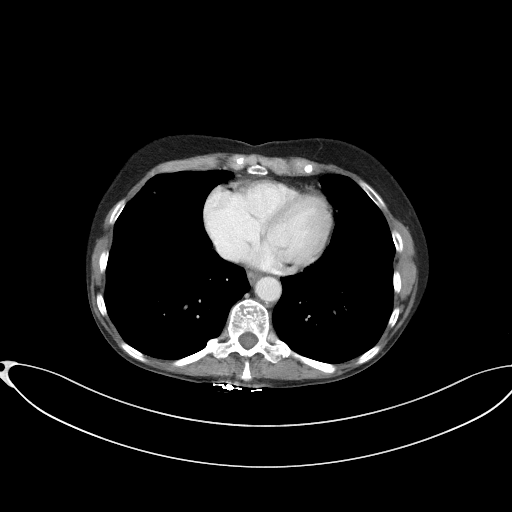

[Series 5: coronal st · coronal · 0.75mm/px · 3 of 86 slices shown]
[im 29/86  soft-tissue]
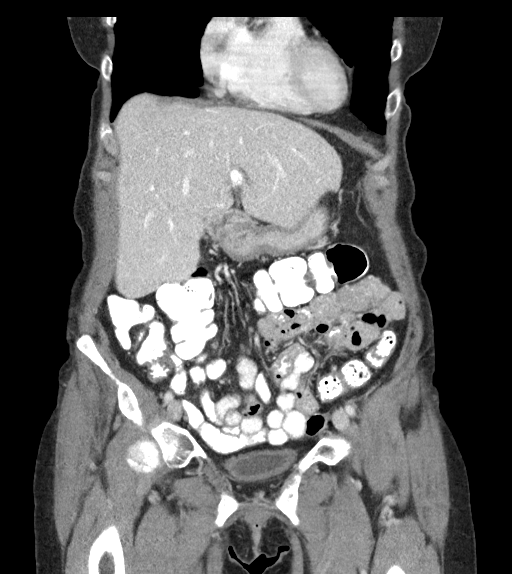
[im 38/86  soft-tissue]
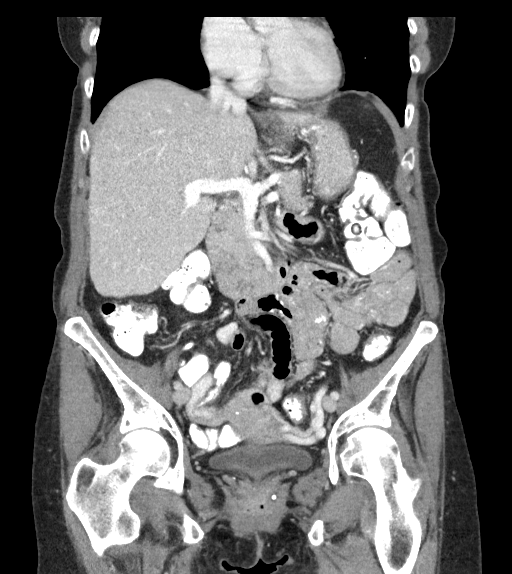
[im 48/86  soft-tissue]
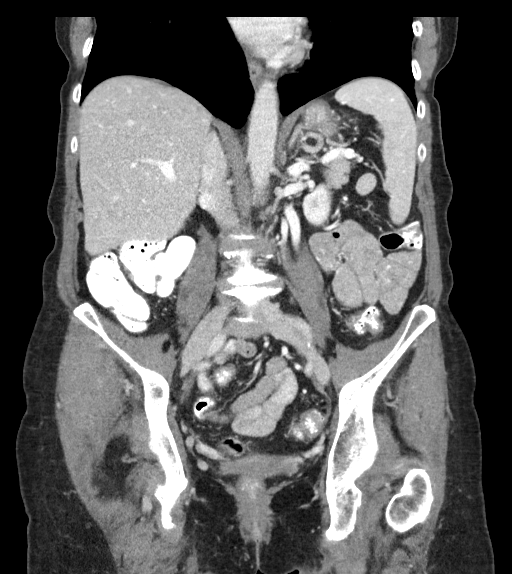

[16 of 46 positions shown; findings below may reference images not displayed]

RADIATION DOSE REDUCTION: This exam was performed according to the
departmental dose-optimization program which includes automated
exposure control, adjustment of the mA and/or kV according to
patient size and/or use of iterative reconstruction technique.

CONTRAST:  100mL OMNIPAQUE IOHEXOL 300 MG/ML  SOLN
FINDINGS: Lower chest: No acute abnormality.

Hepatobiliary: No suspicious hepatic lesion. Gallbladder surgically
absent. No biliary ductal dilation.

Pancreas: No pancreatic ductal dilation or evidence of acute
inflammation.

Spleen: No splenomegaly or focal splenic lesion.

Adrenals/Urinary Tract: Bilateral adrenal glands appear normal. No
hydronephrosis. Tiny hypodense left renal lesion is technically too
small to accurately characterize but statistically likely reflect
cysts. Kidneys demonstrate symmetric enhancement excretion of
contrast material. Urinary bladder is minimally distended limiting
evaluation.

Stomach/Bowel: Radiopaque enteric contrast material traverses the
rectum. Stomach is nondistended limiting evaluation. No pathologic
dilation of small or large bowel. The appendix and terminal ileum
appear normal. No evidence of acute bowel inflammation.

Vascular/Lymphatic: Scattered aortic atherosclerosis without
abdominal aortic aneurysm. No pathologically enlarged abdominal or
pelvic lymph nodes.

Reproductive: Fluid in the endometrial canal with slightly
heterogeneous lower uterine segment/cervix possibly reflecting
patient's known uterine/cervical cancer but poorly evaluated on CT.
15 mm right ovarian cyst. No left adnexal mass.

Other: No significant abdominopelvic free fluid. No discrete omental
or peritoneal nodularity.

Musculoskeletal: No aggressive lytic or blastic lesion of bone.
Multilevel degenerative changes spine.
IMPRESSION: 1. Patient's known uterine/cervical cancer is poorly evaluated on
CT. No evidence of metastatic disease within the abdomen or pelvis.
2. Homogeneous low-density 15 mm right ovarian cyst. No follow-up
imaging recommended. Note: This recommendation does not apply to
premenarchal patients and to those with increased risk (genetic,
family history, elevated tumor markers or other high-risk factors)
of ovarian cancer. Reference: JACR [DATE]):248-254
3.  Aortic Atherosclerosis ([id number]).

## 2022-12-10 ENCOUNTER — Other Ambulatory Visit: Payer: Medicare PPO

## 2022-12-19 ENCOUNTER — Other Ambulatory Visit: Payer: Medicare PPO

## 2022-12-25 ENCOUNTER — Encounter: Payer: Self-pay | Admitting: Gynecologic Oncology

## 2022-12-26 NOTE — Progress Notes (Signed)
Gynecologic Oncology Return Clinic Visit  12/27/22  Reason for Visit: Surveillance visit in the setting of early stage uterine cancer   Treatment History: Oncology History Overview Note  MMR IHC intact MS stable   Endometrial cancer  10/29/2021 Initial Biopsy   EMB: FIGO grade 2 endometrioid endometrial adenocarcinoma.   11/05/2021 Initial Diagnosis   Endometrial cancer ([facility])   11/09/2021 Imaging   CT A/P: 1. Patient's known uterine/cervical cancer is poorly evaluated on CT. No evidence of metastatic disease within the abdomen or pelvis. 2. Homogeneous low-density 15 mm right ovarian cyst. No follow-up imaging recommended. Note: This recommendation does not apply to premenarchal patients and to those with increased risk (genetic, family history, elevated tumor markers or other high-risk factors) of ovarian cancer. Reference: JACR 2020 Feb; 17(2):248-254 3.  Aortic Atherosclerosis (ICD10-I70.0).   11/13/2021 Surgery   TRH/BSO, SLN biopsy, repair of posterior vaginal laceration  Findings:  On EUA, small mobile uterus. On intra-abdominal entry, normal upper abdominal survey. Normal omentum, small and large bowel. Uterus 6-8 cm, normal in appearance. Normal appearing bilateral adnexa. No obvious adenopathy; mapping successful to bilateral obturator SLNs. No pelvic or abdominal evidence of disease.    11/13/2021 Pathology Results   A. LYMPH NODE, SENTINEL, RIGHT OBTURATOR, EXCISION:  - No carcinoma identified in 1 lymph node (0/1).   B. LYMPH NODE, SENTINEL, RIGHT OBTURATOR #2, EXCISION:  - No carcinoma identified in 1 lymph node (0/1).   C. LYMPH NODE, SENTINEL, LEFT OBTURATOR, EXCISION:  - No carcinoma identified in 1 lymph node (0/1).   D. 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  - Tumor invades less than half of of myometrial  thickness.  - Cervix: Unremarkable.  No atypia, dysplasia, or malignancy.  Endometrial carcinoma does not involve cervix.  - Bilateral fallopian tubes: Unremarkable segments of fimbriated  fallopian tube.  No malignancy.  - Right ovary: Benign cyst.  No malignancy.  - Left ovary: Benign cysts.  No malignancy.   ONCOLOGY TABLE:   UTERUS, CARCINOMA OR CARCINOSARCOMA: Resection   Procedure: Total hysterectomy and bilateral salpingo-oophorectomy  Histologic Type: Endometrioid adenocarcinoma  Histologic Grade: 2  Myometrial Invasion:       Depth of Myometrial Invasion (mm): 1 mm       Myometrial Thickness (mm): 11 mm       Percentage of Myometrial Invasion: 9%  Uterine Serosa Involvement: Not identified  Cervical stromal Involvement: Not identified  Extent of involvement of other tissue/organs: Not identified  Peritoneal/Ascitic Fluid: Not applicable  Lymphovascular Invasion: Not identified  Regional Lymph Nodes: Present.  No carcinoma identified in any of 3  lymph nodes (0/3).       Pelvic Lymph Nodes Examined:                                   Sentinel: 3                                   Non-sentinel: 0                                   Total :3       Pelvic Lymph Nodes with  Metastasis: 0                           Macrometastasis: (>2.0 mm): 0                          Micrometastasis: (>0.2 mm and < 2.0 mm): 0                            Isolated Tumor Cells (<0.2 mm): 0                       Laterality of Lymph Node with Tumor: Not applicable                             Extracapsular Extension: Not applicable       Para-aortic Lymph Nodes Examined: 0  Distant Metastasis:       Distant Site(s) Involved: Not applicable  Pathologic Stage Classification (pTNM, AJCC 8th Edition): pT1a pN 0  Ancillary Studies: MMR / MSI testing will be ordered  Representative Tumor Block: D6  Comment(s):  Pancytokeratin was performed on the lymph nodes and is  negative.    12/18/2021 Cancer  Staging   Staging form: Corpus Uteri - Carcinoma and Carcinosarcoma, AJCC 8th Edition - Pathologic stage from 12/18/2021: FIGO Stage IA (pT1a, pN0, cM0) - Signed by Lonie Peak, MD on 12/18/2021 Stage prefix: Initial diagnosis Histologic grade (G): G2 Histologic grading system: 3 grade system   01/29/2022 - 02/12/2022 Radiation Therapy   01/29/2022 through 02/12/2022 Site Technique Total Dose (Gy) Dose per Fx (Gy) Completed Fx Beam Energies  Vagina: Pelvis_vagina HDR-brachy 30/30 6 5/5 Ir-192       Interval History: The patient reports doing well.  She denies any abdominal or pelvic pain.  Reports baseline bowel bladder function.  Has had several episodes of scant vaginal spotting after inserting vaginal estrogen, denies any bleeding with vaginal dilator use.  She has had some weight loss which she attributes to starting citrus bergamot for her cholesterol.  Denies any decreased appetite, nausea, emesis, or early satiety.  Past Medical/Surgical History: Past Medical History:  Diagnosis Date   ADHD (attention deficit hyperactivity disorder)    Anxiety    Arthritis    in thumbs. more in rt thumb   Basal cell carcinoma    Leg and back of R arm    Depression    Hyperlipidemia    Hypokalemia    Hypothyroidism    Lower leg edema    PMB (postmenopausal bleeding) 01/2002   endo polyp   Squamous cell skin cancer    Thyroid disease 2002   hypothyroidism    Past Surgical History:  Procedure Laterality Date   CHOLECYSTECTOMY, LAPAROSCOPIC  06/07/2011   HAMMERTOE RECONSTRUCTION WITH WEIL OSTEOTOMY Right 09/25/2017   Procedure: Right 2-4 metatarsal Weil osteotomies and hammertoe corrections;  Surgeon: Toni Arthurs, MD;  Location: [facility];  Service: Orthopedics;  Laterality: Right;   HAND SURGERY Right    HYSTEROSCOPY  01/07/2002   endo polyp and focal simple hyperplasia   LEFT THUMB SURGERY      POPLITEAL SYNOVIAL CYST EXCISION Left 2008 and 2010   ROBOTIC ASSISTED  TOTAL HYSTERECTOMY WITH BILATERAL SALPINGO OOPHERECTOMY Bilateral 11/13/2021   Procedure: XI ROBOTIC ASSISTED TOTAL HYSTERECTOMY WITH BILATERAL SALPINGO,OOPHORECTOMY;  Surgeon: Carver Fila,  MD;  Location: [facility];  Service: Gynecology;  Laterality: Bilateral;   SENTINEL NODE BIOPSY N/A 11/13/2021   Procedure: SENTINEL NODE BIOPSY;  Surgeon: Carver Fila, MD;  Location: [facility];  Service: Gynecology;  Laterality: N/A;   TONSILLECTOMY      Family History  Problem Relation Age of Onset   Cancer Mother        multiple myeloma   Cancer Father        oral   Diabetes Father    Diabetes Brother    Prostate cancer Brother    Diabetes Brother    Heart disease Brother 91       secondary to drug use   Uterine cancer Maternal Grandmother    Diabetes Paternal Grandmother    Osteoporosis Other    Colon cancer Neg Hx    Breast cancer Neg Hx    Ovarian cancer Neg Hx    Endometrial cancer Neg Hx    Pancreatic cancer Neg Hx     Social History   Socioeconomic History   Marital status: Widowed    Spouse name: Not on file   Number of children: 2   Years of education: Not on file   Highest education level: Master's degree (e.g., MA, MS, MEng, MEd, MSW, MBA)  Occupational History   Occupation: retired  Tobacco Use   Smoking status: Never   Smokeless tobacco: Never  Vaping Use   Vaping Use: Never used  Substance and Sexual Activity   Alcohol use: No   Drug use: No   Sexual activity: Not Currently    Partners: Male    Birth control/protection: Post-menopausal, Surgical  Other Topics Concern   Not on file  Social History Narrative   Husband died 01/17/2007. Not working outside the home.   Social Determinants of Health   Financial Resource Strain: Not on file  Food Insecurity: Not on file  Transportation Needs: Not on file  Physical Activity: Not on file  Stress: Not on file  Social Connections: Not on file    Current Medications:  Current Outpatient Medications:     ACETAMINOPHEN-BUTALBITAL 50-325 MG TABS, Take 1 tablet by mouth daily as needed (tension headache)., Disp: , Rfl:    amphetamine-dextroamphetamine (ADDERALL XR) 30 MG 24 hr capsule, Take 30 mg by mouth every morning., Disp: , Rfl:    ASPERCREME LIDOCAINE EX, Apply 1 application topically daily as needed (pain)., Disp: , Rfl:    aspirin EC 81 MG tablet, Take 81 mg by mouth in the morning and at bedtime. Swallow whole., Disp: , Rfl:    b complex vitamins capsule, Take 1 capsule by mouth daily., Disp: , Rfl:    BIOTIN PO, Take 1 capsule by mouth at bedtime., Disp: , Rfl:    bisacodyl (DULCOLAX) 5 MG EC tablet, Take 5 mg by mouth daily as needed (constipation)., Disp: , Rfl:    buPROPion (WELLBUTRIN XL) 300 MG 24 hr tablet, Take 300 mg by mouth every morning., Disp: , Rfl:    cholecalciferol (VITAMIN D) 1000 UNITS tablet, Take 2,000 Units by mouth at bedtime., Disp: , Rfl:    diclofenac Sodium (VOLTAREN) 1 % GEL, Apply 1 application topically daily as needed (pain)., Disp: , Rfl:    escitalopram (LEXAPRO) 5 MG tablet, , Disp: , Rfl:    [START ON 12/30/2022] Estradiol 10 MCG TABS vaginal tablet, Place 1 tablet (10 mcg total) vaginally 2 (two) times a week., Disp: 12 tablet, Rfl: 6   folic acid (FOLVITE) 400  MCG tablet, Take 400 mcg by mouth daily., Disp: , Rfl:    Ginkgo Biloba 120 MG CAPS, Take 120 mg by mouth at bedtime., Disp: , Rfl:    hydrochlorothiazide 25 MG tablet, Take 25 mg by mouth every morning. Per patient for fluid retention not HTN, Disp: , Rfl:    Ibuprofen 200 MG CAPS, Take 400 mg by mouth 2 (two) times daily as needed (pain)., Disp: , Rfl:    magnesium 30 MG tablet, Take 30 mg by mouth 2 (two) times daily., Disp: , Rfl:    Melatonin 10 MG TABS, Take 10 mg by mouth at bedtime., Disp: , Rfl:    Multiple Vitamin (MULTIVITAMIN WITH MINERALS) TABS tablet, Take 1 tablet by mouth at bedtime., Disp: , Rfl:    Probiotic Product (PROBIOTIC PO), Take 1 tablet by mouth at bedtime., Disp: , Rfl:     thyroid (ARMOUR) 60 MG tablet, Take 60 mg by mouth daily before breakfast., Disp: , Rfl:    Turmeric 500 MG CAPS, Take 500 mg by mouth at bedtime., Disp: , Rfl:    zinc gluconate 50 MG tablet, Take 50 mg by mouth daily., Disp: , Rfl:   Review of Systems: + Weight loss, vaginal bleeding, depression Denies appetite changes, fevers, chills, fatigue. Denies hearing loss, neck lumps or masses, mouth sores, ringing in ears or voice changes. Denies cough or wheezing.  Denies shortness of breath. Denies chest pain or palpitations. Denies leg swelling. Denies abdominal distention, pain, blood in stools, constipation, diarrhea, nausea, vomiting, or early satiety. Denies pain with intercourse, dysuria, frequency, hematuria or incontinence. Denies hot flashes, pelvic pain,  or vaginal discharge.   Denies joint pain, back pain or muscle pain/cramps. Denies itching, rash, or wounds. Denies dizziness, headaches, numbness or seizures. Denies swollen lymph nodes or glands, denies easy bruising or bleeding. Denies anxiety, confusion, or decreased concentration.  Physical Exam: BP 132/69 (BP Location: Left Arm, Patient Position: Sitting)   Pulse 91   Temp 98 F (36.7 C) (Oral)   Wt 110 lb 9.6 oz (50.2 kg)   LMP 09/10/2003   SpO2 100%   BMI 20.36 kg/m  General: Alert, oriented, no acute distress. HEENT: Normocephalic, atraumatic, sclera anicteric. Chest: Clear to auscultation bilaterally.  No wheezes or rhonchi. Cardiovascular: Regular rate and rhythm, no murmurs. Abdomen: soft, nontender.  Normoactive bowel sounds.  No masses or hepatosplenomegaly appreciated.  Well-healed incisions. Extremities: Grossly normal range of motion.  Warm, well perfused.  No edema bilaterally. Skin: No rashes or lesions noted. Lymphatics: No cervical, supraclavicular, or inguinal adenopathy. GU: Normal appearing external genitalia without erythema, excoriation, or lesions.  Speculum exam reveals moderately atrophic  vaginal apex with some mild narrowing just of the apex.  Along the posterior aspect, there is some atypical vascularity with radiation changes, somewhat different from her last exam.  Bimanual exam reveals smooth mucosa, no masses or nodularity.  Rectovaginal exam confirms these findings.  Vaginal biopsy procedure Preoperative diagnosis: vaginal changes in the setting of a history of uterine cancer Postoperative diagnosis: Same as above Physician: Pricilla Holm MD Estimated blood loss: Minimal Specimens: Endometrial biopsy Procedure: After the patient was in dorsolithotomy position with a speculum in the vagina, findings within the vagina were discussed with the patient.  I recommended biopsy procedure.  The patient gave verbal consent.  The area to be biopsied was cleansed with Betadine x 3.  Tischler forceps were then used to take a biopsy from the posterior cuff of the vagina at approximately 5:00.  This was placed in formalin.  Overall the patient tolerated the procedure well.  All instruments were removed from the vagina.  Laboratory & Radiologic Studies: None new  Assessment & Plan: Catherine Peterson is a 72 y.o. 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  MMR intact, MSS.   Patient is overall doing well.  Biopsy taken along the posterior vagina, suspected to be related to radiation but exam different than what I remember from 6 months ago.  I will let her know when I have these results.  She has had some scant bleeding related to insertion of vaginal estrogen cream.  Today we discussed trying vaginal estrogen tablets.  Prescription was sent to her pharmacy for this.   Per NCCN surveillance recommendations, we discussed alternating visits every 3 months between my office and her radiation oncologist.  Scheduled to see Dr. Basilio Cairo in July.  I will see her back in October.  We reviewed signs and symptoms that would be concerning for cancer  recurrence, and I stressed the importance of calling if she develops any of these.  22 minutes of total time was spent for this patient encounter, including preparation, face-to-face counseling with the patient and coordination of care, and documentation of the encounter.  Eugene Garnet, MD  Division of Gynecologic Oncology  Department of Obstetrics and Gynecology  [facility]

## 2022-12-27 ENCOUNTER — Inpatient Hospital Stay: Payer: Medicare PPO | Attending: Gynecologic Oncology | Admitting: Gynecologic Oncology

## 2022-12-27 ENCOUNTER — Encounter: Payer: Self-pay | Admitting: Gynecologic Oncology

## 2022-12-27 ENCOUNTER — Other Ambulatory Visit: Payer: Self-pay

## 2022-12-27 VITALS — BP 132/69 | HR 91 | Temp 98.0°F | Wt 110.6 lb

## 2022-12-27 DIAGNOSIS — Z923 Personal history of irradiation: Secondary | ICD-10-CM | POA: Diagnosis not present

## 2022-12-27 DIAGNOSIS — C541 Malignant neoplasm of endometrium: Secondary | ICD-10-CM

## 2022-12-27 DIAGNOSIS — N888 Other specified noninflammatory disorders of cervix uteri: Secondary | ICD-10-CM

## 2022-12-27 DIAGNOSIS — N952 Postmenopausal atrophic vaginitis: Secondary | ICD-10-CM

## 2022-12-27 DIAGNOSIS — Z90710 Acquired absence of both cervix and uterus: Secondary | ICD-10-CM | POA: Diagnosis not present

## 2022-12-27 DIAGNOSIS — Z8542 Personal history of malignant neoplasm of other parts of uterus: Secondary | ICD-10-CM | POA: Diagnosis present

## 2022-12-27 DIAGNOSIS — Z90722 Acquired absence of ovaries, bilateral: Secondary | ICD-10-CM | POA: Diagnosis not present

## 2022-12-27 MED ORDER — ESTRADIOL 10 MCG VA TABS: 1.0000 | ORAL_TABLET | VAGINAL | 6 refills | Status: AC

## 2022-12-27 NOTE — Patient Instructions (Signed)
It was good to see you today.  Your biopsy will be back next week, I will let you know when I have these results.  I suspect that what I am seeing is related to radiation changes.  I will see you for follow-up in 6 months.  As always, if you develop any new and concerning symptoms before your next visit, please call to see me sooner.

## 2023-01-02 LAB — SURGICAL PATHOLOGY

## 2023-03-12 NOTE — Progress Notes (Signed)
Ms. Emrich presents today for follow-up after completing vaginal brachytherapy on 02/12/2022.    Does the patient complain of any of the following:  Pain: None Abdominal bloating: None Diarrhea/Constipation: None Nausea/Vomiting: None Vaginal Discharge: None Blood in Urine or Stool: None Urinary Issues (dysuria/incomplete emptying/ incontinence/ increased frequency/urgency): None Does patient report using vaginal dilator 2-3 times a week and/or sexually active 2-3 weeks: Yes, using vaginal dilator Post radiation skin changes: None  Vitals- BP 129/64 (BP Location: Left Arm, Patient Position: Sitting, Cuff Size: Normal)   Pulse 78   Temp (!) 97.2 F (36.2 C) (Temporal)   Resp 18   Ht 5' 1.25" (1.556 m)   Wt 112 lb 6.4 oz (51 kg)   LMP 09/10/2003   SpO2 100%   BMI 21.06 kg/m   Additional comments if applicable: None  This concludes the interaction.  Ruel Favors, LPN

## 2023-03-26 ENCOUNTER — Ambulatory Visit
Admission: RE | Admit: 2023-03-26 | Discharge: 2023-03-26 | Disposition: A | Discharge status: Home / Self Care | Payer: Medicare PPO | Source: Ambulatory Visit | Attending: Radiation Oncology | Admitting: Radiation Oncology

## 2023-03-26 ENCOUNTER — Encounter: Payer: Self-pay | Admitting: Radiation Oncology

## 2023-03-26 VITALS — BP 129/64 | HR 78 | Temp 97.2°F | Resp 18 | Ht 61.25 in | Wt 112.4 lb

## 2023-03-26 DIAGNOSIS — C541 Malignant neoplasm of endometrium: Secondary | ICD-10-CM | POA: Insufficient documentation

## 2023-03-26 DIAGNOSIS — Z79899 Other long term (current) drug therapy: Secondary | ICD-10-CM | POA: Diagnosis not present

## 2023-03-26 DIAGNOSIS — Z7982 Long term (current) use of aspirin: Secondary | ICD-10-CM | POA: Diagnosis not present

## 2023-03-26 DIAGNOSIS — Z923 Personal history of irradiation: Secondary | ICD-10-CM | POA: Diagnosis not present

## 2023-03-26 NOTE — Progress Notes (Signed)
Radiation Oncology         [phone] ________________________________  Name: Catherine Peterson MRN: [id number]  Date: 03/26/2023  DOB: 05-Nov-1950  Follow-Up Visit Note  Outpatient  CC: Catherine Peterson., PA-C  Catherine Peterson., PA-C  Diagnosis and Prior Radiotherapy:    ICD-10-CM   1. Endometrial cancer (HCC)  C54.1      Cancer Staging  Endometrial cancer [facility]) Staging form: Corpus Uteri - Carcinoma and Carcinosarcoma, AJCC 8th Edition - Pathologic stage from 12/18/2021: FIGO Stage IA (pT1a, pN0, cM0) - Signed by Lonie Peak, MD on 12/18/2021 Stage prefix: Initial diagnosis Histologic grade (G): G2 Histologic grading system: 3 grade system  Radiation Treatment Dates: 01/29/2022 through 02/12/2022 Site Technique Total Dose (Gy) Dose per Fx (Gy) Completed Fx Beam Energies  Vagina: Pelvis_vagina HDR-brachy 30/30 6 5/5 Ir-192   CHIEF COMPLAINT: Here for follow-up and surveillance of uterine cancer  Narrative:  Catherine Peterson presents today for follow-up after completing vaginal brachytherapy on 02/12/2022.   Pain: None Abdominal bloating: None Diarrhea/Constipation: None Nausea/Vomiting: None Vaginal Discharge: None Blood in Urine or Stool: None Urinary Issues (dysuria/incomplete emptying/ incontinence/ increased frequency/urgency): None Does patient report using vaginal dilator 2-3 times a week and/or sexually active 2-3 weeks: Yes, using vaginal dilator Post radiation skin changes: None  BP 129/64 (BP Location: Left Arm, Patient Position: Sitting, Cuff Size: Normal)   Pulse 78   Temp (!) 97.2 F (36.2 C) (Temporal)   Resp 18   Ht 5' 1.25" (1.556 m)   Wt 112 lb 6.4 oz (51 kg)   LMP 09/10/2003   SpO2 100%   BMI 21.06 kg/m   Vitals:   03/26/23 1114  BP: 129/64  Pulse: 78  Resp: 18  Temp: (!) 97.2 F (36.2 C)  SpO2: 100%   No major concerns or questions. Saw OBGyn in April. Dr. Pricilla Peterson had noted atypical vascularity on physical exam. She obtained a biopsy of this  area and pathology fortunately showed benign squamous mucosa with no evidence of dysplasia or malignancy.   ALLERGIES:  is allergic to codeine and coumadin [warfarin].  Meds: Current Outpatient Medications  Medication Sig Dispense Refill   ACETAMINOPHEN-BUTALBITAL 50-325 MG TABS Take 1 tablet by mouth daily as needed (tension headache).     amphetamine-dextroamphetamine (ADDERALL XR) 30 MG 24 hr capsule Take 30 mg by mouth every morning.     ASPERCREME LIDOCAINE EX Apply 1 application topically daily as needed (pain).     aspirin EC 81 MG tablet Take 81 mg by mouth in the morning and at bedtime. Swallow whole.     b complex vitamins capsule Take 1 capsule by mouth daily.     BIOTIN PO Take 1 capsule by mouth at bedtime.     bisacodyl (DULCOLAX) 5 MG EC tablet Take 5 mg by mouth daily as needed (constipation).     buPROPion (WELLBUTRIN XL) 300 MG 24 hr tablet Take 300 mg by mouth every morning.     cholecalciferol (VITAMIN D) 1000 UNITS tablet Take 2,000 Units by mouth at bedtime.     diclofenac Sodium (VOLTAREN) 1 % GEL Apply 1 application topically daily as needed (pain).     escitalopram (LEXAPRO) 5 MG tablet      Estradiol 10 MCG TABS vaginal tablet Place 1 tablet (10 mcg total) vaginally 2 (two) times a week. 12 tablet 6   folic acid (FOLVITE) 400 MCG tablet Take 400 mcg by mouth daily.     Ginkgo Biloba 120 MG CAPS Take  120 mg by mouth at bedtime.     hydrochlorothiazide 25 MG tablet Take 25 mg by mouth every morning. Per patient for fluid retention not HTN     Ibuprofen 200 MG CAPS Take 400 mg by mouth 2 (two) times daily as needed (pain).     magnesium 30 MG tablet Take 30 mg by mouth 2 (two) times daily.     Melatonin 10 MG TABS Take 10 mg by mouth at bedtime.     Multiple Vitamin (MULTIVITAMIN WITH MINERALS) TABS tablet Take 1 tablet by mouth at bedtime.     Probiotic Product (PROBIOTIC PO) Take 1 tablet by mouth at bedtime.     thyroid (ARMOUR) 60 MG tablet Take 60 mg by mouth  daily before breakfast.     Turmeric 500 MG CAPS Take 500 mg by mouth at bedtime.     zinc gluconate 50 MG tablet Take 50 mg by mouth daily.     No current facility-administered medications for this encounter.    Physical Findings: The patient is in no acute distress. Patient is alert and oriented.  height is 5' 1.25" (1.556 m) and weight is 112 lb 6.4 oz (51 kg). Her temporal temperature is 97.2 F (36.2 C) (abnormal). Her blood pressure is 129/64 and her pulse is 78. Her respiration is 18 and oxygen saturation is 100%. .    Gen: Well-appearing and in no acute distress HEENT: normocephalic, ROM grossly intact Cardiovascular: regular in rate and rhythm with no murmurs.  Pulmonary: clear to auscultation in all lung fields Lymphatics: no palpable cervical, supraclavicular, or axillary lymph nodes Abdominal: Normoactive bowel sounds, with no tenderness to palpation.   GYN: Speculum exam revealed telangiectasias at dorsal apex of vagina.  No evidence of dehiscence at the vaginal apex. There is still some narrowing at the apex. Vaginal cuff intact. No sign of nodularity or tumor.     Lab Findings: Lab Results  Component Value Date   WBC 5.9 11/06/2021   HGB 14.3 11/06/2021   HCT 41.4 11/06/2021   MCV 91.0 11/06/2021   PLT 269 11/06/2021   Radiographic Findings: No results found.  Impression/Plan:  She is continues to heal well from the radiotherapy. No evidence of disease recurrence on physical exam today.   She knows to continue using her dilator at least 3 times a week.  She sees gynecologic oncology in 37mo, sooner prn.  I will see her back for follow-up in 90mo. She is pleased with this plan.  On date of service, in total, I spent 30 minutes on this encounter. Patient was seen in person. Note signed after encounter date; minutes pertain to date of service, only.  _____________________________________   Catherine Faster, PA-C    Lonie Peak, MD

## 2023-03-28 ENCOUNTER — Encounter: Payer: Self-pay | Admitting: Radiation Oncology

## 2023-05-06 ENCOUNTER — Other Ambulatory Visit: Payer: Self-pay | Admitting: Family Medicine

## 2023-05-06 DIAGNOSIS — Z1231 Encounter for screening mammogram for malignant neoplasm of breast: Secondary | ICD-10-CM

## 2023-05-15 DIAGNOSIS — L308 Other specified dermatitis: Secondary | ICD-10-CM | POA: Diagnosis not present

## 2023-05-15 DIAGNOSIS — Z85828 Personal history of other malignant neoplasm of skin: Secondary | ICD-10-CM | POA: Diagnosis not present

## 2023-05-15 DIAGNOSIS — L814 Other melanin hyperpigmentation: Secondary | ICD-10-CM | POA: Diagnosis not present

## 2023-05-15 DIAGNOSIS — L570 Actinic keratosis: Secondary | ICD-10-CM | POA: Diagnosis not present

## 2023-05-15 DIAGNOSIS — L578 Other skin changes due to chronic exposure to nonionizing radiation: Secondary | ICD-10-CM | POA: Diagnosis not present

## 2023-05-15 DIAGNOSIS — L821 Other seborrheic keratosis: Secondary | ICD-10-CM | POA: Diagnosis not present

## 2023-05-15 DIAGNOSIS — D229 Melanocytic nevi, unspecified: Secondary | ICD-10-CM | POA: Diagnosis not present

## 2023-05-15 DIAGNOSIS — D1801 Hemangioma of skin and subcutaneous tissue: Secondary | ICD-10-CM | POA: Diagnosis not present

## 2023-05-15 DIAGNOSIS — D485 Neoplasm of uncertain behavior of skin: Secondary | ICD-10-CM | POA: Diagnosis not present

## 2023-05-29 DIAGNOSIS — E7800 Pure hypercholesterolemia, unspecified: Secondary | ICD-10-CM | POA: Diagnosis not present

## 2023-06-17 ENCOUNTER — Ambulatory Visit
Admission: RE | Admit: 2023-06-17 | Discharge: 2023-06-17 | Disposition: A | Discharge status: Home / Self Care | Payer: Medicare PPO | Source: Ambulatory Visit | Attending: Family Medicine | Admitting: Family Medicine

## 2023-06-17 DIAGNOSIS — Z1231 Encounter for screening mammogram for malignant neoplasm of breast: Secondary | ICD-10-CM | POA: Diagnosis not present

## 2023-06-17 DIAGNOSIS — Z79899 Other long term (current) drug therapy: Secondary | ICD-10-CM | POA: Diagnosis not present

## 2023-06-17 DIAGNOSIS — E7801 Familial hypercholesterolemia: Secondary | ICD-10-CM | POA: Diagnosis not present

## 2023-06-17 DIAGNOSIS — F325 Major depressive disorder, single episode, in full remission: Secondary | ICD-10-CM | POA: Diagnosis not present

## 2023-06-23 ENCOUNTER — Ambulatory Visit
Admission: RE | Admit: 2023-06-23 | Discharge: 2023-06-23 | Disposition: A | Discharge status: Home / Self Care | Payer: Medicare PPO | Source: Ambulatory Visit | Attending: Family Medicine | Admitting: Family Medicine

## 2023-06-23 DIAGNOSIS — E349 Endocrine disorder, unspecified: Secondary | ICD-10-CM | POA: Diagnosis not present

## 2023-06-23 DIAGNOSIS — E2839 Other primary ovarian failure: Secondary | ICD-10-CM

## 2023-06-23 DIAGNOSIS — N958 Other specified menopausal and perimenopausal disorders: Secondary | ICD-10-CM | POA: Diagnosis not present

## 2023-06-23 DIAGNOSIS — M8588 Other specified disorders of bone density and structure, other site: Secondary | ICD-10-CM | POA: Diagnosis not present

## 2023-06-26 ENCOUNTER — Encounter: Payer: Self-pay | Admitting: Gynecologic Oncology

## 2023-06-30 DIAGNOSIS — H16223 Keratoconjunctivitis sicca, not specified as Sjogren's, bilateral: Secondary | ICD-10-CM | POA: Diagnosis not present

## 2023-06-30 DIAGNOSIS — H35433 Paving stone degeneration of retina, bilateral: Secondary | ICD-10-CM | POA: Diagnosis not present

## 2023-07-02 NOTE — Progress Notes (Unsigned)
Gynecologic Oncology Return Clinic Visit  07/03/23  Reason for Visit: Surveillance visit in the setting of early stage uterine cancer   Treatment History: Oncology History Overview Note  MMR IHC intact MS stable   Endometrial cancer (HCC)  10/29/2021 Initial Biopsy   EMB: FIGO grade 2 endometrioid endometrial adenocarcinoma.   11/05/2021 Initial Diagnosis   Endometrial cancer (HCC)   11/09/2021 Imaging   CT A/P: 1. Patient's known uterine/cervical cancer is poorly evaluated on CT. No evidence of metastatic disease within the abdomen or pelvis. 2. Homogeneous low-density 15 mm right ovarian cyst. No follow-up imaging recommended. Note: This recommendation does not apply to premenarchal patients and to those with increased risk (genetic, family history, elevated tumor markers or other high-risk factors) of ovarian cancer. Reference: JACR 2020 Feb; 17(2):248-254 3.  Aortic Atherosclerosis (ICD10-I70.0).   11/13/2021 Surgery   TRH/BSO, SLN biopsy, repair of posterior vaginal laceration  Findings:  On EUA, small mobile uterus. On intra-abdominal entry, normal upper abdominal survey. Normal omentum, small and large bowel. Uterus 6-8 cm, normal in appearance. Normal appearing bilateral adnexa. No obvious adenopathy; mapping successful to bilateral obturator SLNs. No pelvic or abdominal evidence of disease.    11/13/2021 Pathology Results   A. LYMPH NODE, SENTINEL, RIGHT OBTURATOR, EXCISION:  - No carcinoma identified in 1 lymph node (0/1).   B. LYMPH NODE, SENTINEL, RIGHT OBTURATOR #2, EXCISION:  - No carcinoma identified in 1 lymph node (0/1).   C. LYMPH NODE, SENTINEL, LEFT OBTURATOR, EXCISION:  - No carcinoma identified in 1 lymph node (0/1).   D. 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  - Tumor invades less than half of of myometrial  thickness.  - Cervix: Unremarkable.  No atypia, dysplasia, or malignancy.  Endometrial carcinoma does not involve cervix.  - Bilateral fallopian tubes: Unremarkable segments of fimbriated  fallopian tube.  No malignancy.  - Right ovary: Benign cyst.  No malignancy.  - Left ovary: Benign cysts.  No malignancy.   ONCOLOGY TABLE:   UTERUS, CARCINOMA OR CARCINOSARCOMA: Resection   Procedure: Total hysterectomy and bilateral salpingo-oophorectomy  Histologic Type: Endometrioid adenocarcinoma  Histologic Grade: 2  Myometrial Invasion:       Depth of Myometrial Invasion (mm): 1 mm       Myometrial Thickness (mm): 11 mm       Percentage of Myometrial Invasion: 9%  Uterine Serosa Involvement: Not identified  Cervical stromal Involvement: Not identified  Extent of involvement of other tissue/organs: Not identified  Peritoneal/Ascitic Fluid: Not applicable  Lymphovascular Invasion: Not identified  Regional Lymph Nodes: Present.  No carcinoma identified in any of 3  lymph nodes (0/3).       Pelvic Lymph Nodes Examined:                                   Sentinel: 3                                   Non-sentinel: 0                                   Total :3       Pelvic Lymph Nodes  with Metastasis: 0                           Macrometastasis: (>2.0 mm): 0                          Micrometastasis: (>0.2 mm and < 2.0 mm): 0                            Isolated Tumor Cells (<0.2 mm): 0                       Laterality of Lymph Node with Tumor: Not applicable                             Extracapsular Extension: Not applicable       Para-aortic Lymph Nodes Examined: 0  Distant Metastasis:       Distant Site(s) Involved: Not applicable  Pathologic Stage Classification (pTNM, AJCC 8th Edition): pT1a pN 0  Ancillary Studies: MMR / MSI testing will be ordered  Representative Tumor Block: D6  Comment(s):  Pancytokeratin was performed on the lymph nodes and is  negative.    12/18/2021 Cancer  Staging   Staging form: Corpus Uteri - Carcinoma and Carcinosarcoma, AJCC 8th Edition - Pathologic stage from 12/18/2021: FIGO Stage IA (pT1a, pN0, cM0) - Signed by Lonie Peak, MD on 12/18/2021 Stage prefix: Initial diagnosis Histologic grade (G): G2 Histologic grading system: 3 grade system   01/29/2022 - 02/12/2022 Radiation Therapy   01/29/2022 through 02/12/2022 Site Technique Total Dose (Gy) Dose per Fx (Gy) Completed Fx Beam Energies  Vagina: Pelvis_vagina HDR-brachy 30/30 6 5/5 Ir-192       Interval History: Doing well.  Denies any vaginal bleeding or discharge.  Reports baseline bowel bladder function.  Has spoken with her PCP about intermittent pain in the back of her throat when she swallows, seems to be dependent on what she eats.  Past Medical/Surgical History: Past Medical History:  Diagnosis Date   ADHD (attention deficit hyperactivity disorder)    Anxiety    Arthritis    in thumbs. more in rt thumb   Basal cell carcinoma    Leg and back of R arm    Depression    Hyperlipidemia    Hypokalemia    Hypothyroidism    Lower leg edema    PMB (postmenopausal bleeding) 01/2002   endo polyp   Squamous cell skin cancer    Thyroid disease 2002   hypothyroidism    Past Surgical History:  Procedure Laterality Date   CHOLECYSTECTOMY, LAPAROSCOPIC  06/07/2011   HAMMERTOE RECONSTRUCTION WITH WEIL OSTEOTOMY Right 09/25/2017   Procedure: Right 2-4 metatarsal Weil osteotomies and hammertoe corrections;  Surgeon: Toni Arthurs, MD;  Location: [facility];  Service: Orthopedics;  Laterality: Right;   HAND SURGERY Right    HYSTEROSCOPY  01/07/2002   endo polyp and focal simple hyperplasia   LEFT THUMB SURGERY      POPLITEAL SYNOVIAL CYST EXCISION Left 2008 and 2010   ROBOTIC ASSISTED TOTAL HYSTERECTOMY WITH BILATERAL SALPINGO OOPHERECTOMY Bilateral 11/13/2021   Procedure: XI ROBOTIC ASSISTED TOTAL HYSTERECTOMY WITH BILATERAL SALPINGO,OOPHORECTOMY;  Surgeon: Carver Fila, MD;  Location: [facility];  Service: Gynecology;  Laterality: Bilateral;   SENTINEL NODE BIOPSY N/A 11/13/2021   Procedure: SENTINEL NODE BIOPSY;  Surgeon: Carver Fila, MD;  Location: [facility];  Service: Gynecology;  Laterality: N/A;   TONSILLECTOMY      Family History  Problem Relation Age of Onset   Cancer Mother        multiple myeloma   Cancer Father        oral   Diabetes Father    Diabetes Brother    Prostate cancer Brother    Diabetes Brother    Heart disease Brother 31       secondary to drug use   Uterine cancer Maternal Grandmother    Diabetes Paternal Grandmother    Osteoporosis Other    Colon cancer Neg Hx    Breast cancer Neg Hx    Ovarian cancer Neg Hx    Endometrial cancer Neg Hx    Pancreatic cancer Neg Hx     Social History   Socioeconomic History   Marital status: Widowed    Spouse name: Not on file   Number of children: 2   Years of education: Not on file   Highest education level: Master's degree (e.g., MA, MS, MEng, MEd, MSW, MBA)  Occupational History   Occupation: retired  Tobacco Use   Smoking status: Never   Smokeless tobacco: Never  Vaping Use   Vaping status: Never Used  Substance and Sexual Activity   Alcohol use: No   Drug use: No   Sexual activity: Not Currently    Partners: Male    Birth control/protection: Post-menopausal, Surgical  Other Topics Concern   Not on file  Social History Narrative   Husband died Jul 22, 2007. Not working outside the home.   Social Determinants of Health   Financial Resource Strain: Low Risk  (06/11/2021)   Received from [facility] visits prior to 11/09/2022., [facility] visits prior to 11/09/2022.   Overall Financial Resource Strain (CARDIA)    Difficulty of Paying Living Expenses: Not hard at all  Food Insecurity: Low Risk  (05/26/2023)   [facility]   Hunger Vital Sign    Worried About Running Out of Food in the Last Year: Never  true    Ran Out of Food in the Last Year: Never true  Transportation Needs: No Transportation Needs (05/26/2023)   Received from [employer]    In the past 12 months, has lack of reliable transportation kept you from medical appointments, meetings, work or from getting things needed for daily living? : No  Physical Activity: Sufficiently Active (06/11/2021)   Received from [facility] visits prior to 11/09/2022., [facility] visits prior to 11/09/2022.   Exercise Vital Sign    Days of Exercise per Week: 5 days    Minutes of Exercise per Session: 60 min  Stress: No Stress Concern Present (06/11/2021)   [facility] prior to 11/09/2022., [facility] visits prior to 11/09/2022.   [employer] of Occupational Health - Occupational Stress Questionnaire    Feeling of Stress : Only a little  Social Connections: Moderately Integrated (06/11/2021)   Received from [facility] visits prior to 11/09/2022., [facility] visits prior to 11/09/2022.   Social Connection and Isolation Panel [NHANES]    Frequency of Communication with Friends and Family: More than three times a week    Frequency of Social Gatherings with Friends and Family: More than three times  a week    Attends Religious Services: More than 4 times per year    Active Member of Clubs or Organizations: Yes    Attends Banker Meetings: More than 4 times per year    Marital Status: Widowed    Current Medications:  Current Outpatient Medications:    ACETAMINOPHEN-BUTALBITAL 50-325 MG TABS, Take 1 tablet by mouth daily as needed (tension headache)., Disp: , Rfl:    amphetamine-dextroamphetamine (ADDERALL XR) 30 MG 24 hr capsule, Take 30 mg by mouth every morning., Disp: , Rfl:    ASPERCREME LIDOCAINE EX, Apply 1 application topically daily as needed (pain)., Disp: , Rfl:     aspirin EC 81 MG tablet, Take 81 mg by mouth in the morning and at bedtime. Swallow whole., Disp: , Rfl:    b complex vitamins capsule, Take 1 capsule by mouth daily., Disp: , Rfl:    BIOTIN PO, Take 1 capsule by mouth at bedtime., Disp: , Rfl:    bisacodyl (DULCOLAX) 5 MG EC tablet, Take 5 mg by mouth daily as needed (constipation)., Disp: , Rfl:    buPROPion (WELLBUTRIN XL) 300 MG 24 hr tablet, Take 300 mg by mouth every morning., Disp: , Rfl:    cholecalciferol (VITAMIN D) 1000 UNITS tablet, Take 2,000 Units by mouth at bedtime., Disp: , Rfl:    diclofenac Sodium (VOLTAREN) 1 % GEL, Apply 1 application topically daily as needed (pain)., Disp: , Rfl:    Estradiol 10 MCG TABS vaginal tablet, Place 1 tablet (10 mcg total) vaginally 2 (two) times a week., Disp: 12 tablet, Rfl: 6   folic acid (FOLVITE) 400 MCG tablet, Take 400 mcg by mouth daily., Disp: , Rfl:    Ginkgo Biloba 120 MG CAPS, Take 120 mg by mouth at bedtime., Disp: , Rfl:    hydrochlorothiazide 25 MG tablet, Take 25 mg by mouth every morning. Per patient for fluid retention not HTN, Disp: , Rfl:    Ibuprofen 200 MG CAPS, Take 400 mg by mouth 2 (two) times daily as needed (pain)., Disp: , Rfl:    magnesium 30 MG tablet, Take 30 mg by mouth 2 (two) times daily., Disp: , Rfl:    Melatonin 10 MG TABS, Take 10 mg by mouth at bedtime., Disp: , Rfl:    Multiple Vitamin (MULTIVITAMIN WITH MINERALS) TABS tablet, Take 1 tablet by mouth at bedtime., Disp: , Rfl:    Probiotic Product (PROBIOTIC PO), Take 1 tablet by mouth at bedtime., Disp: , Rfl:    thyroid (ARMOUR) 60 MG tablet, Take 60 mg by mouth daily before breakfast., Disp: , Rfl:    Turmeric 500 MG CAPS, Take 500 mg by mouth at bedtime., Disp: , Rfl:    zinc gluconate 50 MG tablet, Take 50 mg by mouth daily., Disp: , Rfl:   Review of Systems: + Hearing loss, mouth sores, ringing in ears, lower back pain, headache related to allergies, anxiety. Denies appetite changes, fevers,  chills, fatigue, unexplained weight changes. Denies neck lumps or masses or voice changes. Denies cough or wheezing.  Denies shortness of breath. Denies chest pain or palpitations. Denies leg swelling. Denies abdominal distention, pain, blood in stools, constipation, diarrhea, nausea, vomiting, or early satiety. Denies pain with intercourse, dysuria, frequency, hematuria or incontinence. Denies hot flashes, pelvic pain, vaginal bleeding or vaginal discharge.   Denies joint pain or muscle pain/cramps. Denies itching, rash, or wounds. Denies dizziness, numbness or seizures. Denies swollen lymph nodes or glands, denies easy bruising or bleeding. Denies  depression, confusion, or decreased concentration.  Physical Exam: BP 131/79 (BP Location: Left Arm, Patient Position: Sitting) Comment: Recheck First Was 141/72  Pulse 99   Temp 97.6 F (36.4 C) (Oral)   Resp 20   Wt 115 lb 9.6 oz (52.4 kg)   LMP 09/10/2003   SpO2 100%   BMI 21.66 kg/m  General: Alert, oriented, no acute distress. HEENT: Normocephalic, atraumatic, sclera anicteric. Chest: Clear to auscultation bilaterally.  No wheezes or rhonchi. Cardiovascular: Regular rate and rhythm, no murmurs. Abdomen: soft, nontender.  Normoactive bowel sounds.  No masses or hepatosplenomegaly appreciated.  Well-healed incisions. Extremities: Grossly normal range of motion.  Warm, well perfused.  No edema bilaterally. Skin: No rashes or lesions noted. Lymphatics: No cervical, supraclavicular, or inguinal adenopathy. GU: Normal appearing external genitalia without erythema, excoriation, or lesions.  Speculum exam reveals moderately atrophic vaginal apex with some mild narrowing just of the apex.  Along the posterior aspect, there is some atypical vascularity with radiation changes, stable from her last exam.  Bimanual exam reveals smooth mucosa, no masses or nodularity.  Rectovaginal exam confirms these findings.  Laboratory & Radiologic  Studies: Vaginal biopsy 12/2022: Benign squamous mucosa, fibrosis and focal acute inflammation, no dysplasia or malignancy  Assessment & Plan: Catherine Peterson is a 72 y.o. 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  MMR intact, MSS.   Patient is overall doing well.  She is NED on exam today, vaginal exam findings unchanged from her last visit.  She was encouraged to continue using her vaginal dilator.    Per NCCN surveillance recommendations, we discussed alternating visits every 3 months between my office and her radiation oncologist for 2-3 years for we transition to visits every 6 months.  Scheduled to see Dr. Basilio Cairo in January.  I will see her back in April.  We reviewed signs and symptoms that would be concerning for cancer recurrence, and I stressed the importance of calling if she develops any of these.  20 minutes of total time was spent for this patient encounter, including preparation, face-to-face counseling with the patient and coordination of care, and documentation of the encounter.  Eugene Garnet, MD  Division of Gynecologic Oncology  Department of Obstetrics and Gynecology  [facility]

## 2023-07-03 ENCOUNTER — Inpatient Hospital Stay: Payer: Medicare PPO | Attending: Gynecologic Oncology | Admitting: Gynecologic Oncology

## 2023-07-03 ENCOUNTER — Encounter: Payer: Self-pay | Admitting: Gynecologic Oncology

## 2023-07-03 VITALS — BP 131/79 | HR 99 | Temp 97.6°F | Resp 20 | Wt 115.6 lb

## 2023-07-03 DIAGNOSIS — Z90710 Acquired absence of both cervix and uterus: Secondary | ICD-10-CM | POA: Diagnosis not present

## 2023-07-03 DIAGNOSIS — Z90722 Acquired absence of ovaries, bilateral: Secondary | ICD-10-CM | POA: Diagnosis not present

## 2023-07-03 DIAGNOSIS — Z08 Encounter for follow-up examination after completed treatment for malignant neoplasm: Secondary | ICD-10-CM

## 2023-07-03 DIAGNOSIS — C541 Malignant neoplasm of endometrium: Secondary | ICD-10-CM

## 2023-07-03 DIAGNOSIS — Z8542 Personal history of malignant neoplasm of other parts of uterus: Secondary | ICD-10-CM | POA: Insufficient documentation

## 2023-07-03 NOTE — Patient Instructions (Signed)
It was good to see you today.  I do not see or feel any evidence of cancer recurrence on your exam.  I will see you for follow-up in 6 months.  As always, if you develop any new and concerning symptoms before your next visit, please call to see me sooner.   

## 2023-07-13 ENCOUNTER — Encounter: Payer: Self-pay | Admitting: Radiation Oncology

## 2023-07-15 DIAGNOSIS — M7122 Synovial cyst of popliteal space [Baker], left knee: Secondary | ICD-10-CM | POA: Diagnosis not present

## 2023-07-15 DIAGNOSIS — M7918 Myalgia, other site: Secondary | ICD-10-CM | POA: Diagnosis not present

## 2023-08-18 DIAGNOSIS — Z0000 Encounter for general adult medical examination without abnormal findings: Secondary | ICD-10-CM | POA: Diagnosis not present

## 2023-08-18 DIAGNOSIS — Z23 Encounter for immunization: Secondary | ICD-10-CM | POA: Diagnosis not present

## 2023-08-18 DIAGNOSIS — E7800 Pure hypercholesterolemia, unspecified: Secondary | ICD-10-CM | POA: Diagnosis not present

## 2023-08-18 DIAGNOSIS — R41840 Attention and concentration deficit: Secondary | ICD-10-CM | POA: Diagnosis not present

## 2023-08-18 DIAGNOSIS — F325 Major depressive disorder, single episode, in full remission: Secondary | ICD-10-CM | POA: Diagnosis not present

## 2023-08-18 DIAGNOSIS — E039 Hypothyroidism, unspecified: Secondary | ICD-10-CM | POA: Diagnosis not present

## 2023-08-18 DIAGNOSIS — F988 Other specified behavioral and emotional disorders with onset usually occurring in childhood and adolescence: Secondary | ICD-10-CM | POA: Diagnosis not present

## 2023-08-18 DIAGNOSIS — F419 Anxiety disorder, unspecified: Secondary | ICD-10-CM | POA: Diagnosis not present

## 2023-09-23 DIAGNOSIS — L570 Actinic keratosis: Secondary | ICD-10-CM | POA: Diagnosis not present

## 2023-09-26 ENCOUNTER — Ambulatory Visit
Admission: RE | Admit: 2023-09-26 | Discharge: 2023-09-26 | Disposition: A | Discharge status: Home / Self Care | Payer: Medicare PPO | Source: Ambulatory Visit | Attending: Radiation Oncology | Admitting: Radiation Oncology

## 2023-09-26 ENCOUNTER — Encounter: Payer: Self-pay | Admitting: Radiation Oncology

## 2023-09-26 VITALS — BP 121/56 | HR 71 | Temp 97.8°F | Resp 18 | Ht 61.5 in | Wt 118.0 lb

## 2023-09-26 DIAGNOSIS — Z923 Personal history of irradiation: Secondary | ICD-10-CM | POA: Insufficient documentation

## 2023-09-26 DIAGNOSIS — C541 Malignant neoplasm of endometrium: Secondary | ICD-10-CM

## 2023-09-26 DIAGNOSIS — Z79899 Other long term (current) drug therapy: Secondary | ICD-10-CM | POA: Diagnosis not present

## 2023-09-26 DIAGNOSIS — Z7982 Long term (current) use of aspirin: Secondary | ICD-10-CM | POA: Insufficient documentation

## 2023-09-26 DIAGNOSIS — Z8542 Personal history of malignant neoplasm of other parts of uterus: Secondary | ICD-10-CM | POA: Insufficient documentation

## 2023-09-26 DIAGNOSIS — M549 Dorsalgia, unspecified: Secondary | ICD-10-CM | POA: Diagnosis not present

## 2023-09-26 NOTE — Progress Notes (Signed)
Ms. Catherine Peterson presents today for follow-up after completing vaginal brachytherapy on 02/12/2022.   Does the patient complain of any of the following:  Pain: Reports pain/discomfort along her lower back and lower abdomen before she has a BM Abdominal bloating: Denies Diarrhea/Constipation: Denies Nausea/Vomiting: Denies Vaginal Discharge: Only after she uses vaginal estradiol Blood in Urine or Stool: Denies Urinary Issues (dysuria/incomplete emptying/ incontinence/ increased frequency/urgency): Denies Does patient report using vaginal dilator 2-3 times a week and/or sexually active 2-3 weeks: Using times a week Post radiation skin changes: Denies  Additional comments if applicable: Saw Dr. Eugene Garnet on 07/03/2023 --Interval History: Doing well.  Denies any vaginal bleeding or discharge.  Reports baseline bowel bladder function.  Has spoken with her PCP about intermittent pain in the back of her throat when she swallows, seems to be dependent on what she eats. --Assessment & Plan: Catherine Peterson is a 73 y.o. 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  MMR intact, MSS. Patient is overall doing well.  She is NED on exam today, vaginal exam findings unchanged from her last visit.  She was encouraged to continue using her vaginal dilator. Per NCCN surveillance recommendations, we discussed alternating visits every 3 months between my office and her radiation oncologist for 2-3 years for we transition to visits every 6 months.  Scheduled to see Dr. Basilio Cairo in January.  I will see her back in April.  We reviewed signs and symptoms that would be concerning for cancer recurrence, and I stressed the importance of calling if she develops any of these.

## 2023-09-26 NOTE — Progress Notes (Signed)
Radiation Oncology         [phone] ________________________________  Name: Catherine Peterson MRN: [id number]  Date: 09/26/2023  DOB: [birth year]  Follow-Up Visit Note  Outpatient  CC: Richmond Campbell., PA-C  Richmond Campbell., PA-C  Diagnosis and Prior Radiotherapy:    ICD-10-CM   1. Endometrial cancer (HCC)  C54.1      Cancer Staging  Endometrial cancer [facility]) Staging form: Corpus Uteri - Carcinoma and Carcinosarcoma, AJCC 8th Edition - Pathologic stage from 12/18/2021: FIGO Stage IA (pT1a, pN0, cM0) - Signed by Lonie Peak, MD on 12/18/2021 Stage prefix: Initial diagnosis Histologic grade (G): G2 Histologic grading system: 3 grade system  Radiation Treatment Dates: 01/29/2022 through 02/12/2022 Site Technique Total Dose (Gy) Dose per Fx (Gy) Completed Fx Beam Energies  Vagina: Pelvis_vagina HDR-brachy 30/30 6 5/5 Ir-192   CHIEF COMPLAINT: Here for follow-up and surveillance of uterine cancer  Narrative:  Catherine Peterson presents today for follow-up after completing vaginal brachytherapy on 02/12/2022.    Catherine Peterson presents today for follow-up after completing vaginal brachytherapy on 02/12/2022.   Does the patient complain of any of the following:  Pain: Reports pain/discomfort along her lower back and lower abdomen before she has a BM and then symptom passes Abdominal bloating: Denies Diarrhea/Constipation: Denies Nausea/Vomiting: Denies Vaginal Discharge: Only after she uses vaginal estradiol Blood in Urine or Stool: Denies Urinary Issues (dysuria/incomplete emptying/ incontinence/ increased frequency/urgency): Denies Does patient report using vaginal dilator 2-3 times a week and/or sexually active 2-3 weeks: Using multiple times a week Post radiation skin changes: Denies  Additional comments if applicable: Saw Dr. Eugene Garnet on 07/03/2023 --Interval History: Doing well.  Denies any vaginal bleeding or discharge.  Reports baseline bowel bladder function.  Has  spoken with her PCP about intermittent pain in the back of her throat when she swallows, seems to be dependent on what she eats. --Assessment & Plan: Catherine Peterson is a 73 y.o. 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  MMR intact, MSS. Patient is overall doing well.  She is NED on exam today, vaginal exam findings unchanged from her last visit.  She was encouraged to continue using her vaginal dilator. Per NCCN surveillance recommendations, we discussed alternating visits every 3 months between my office and her radiation oncologist for 2-3 years for we transition to visits every 6 months.  Scheduled to see Dr. Basilio Cairo in January.  I will see her back in April.  We reviewed signs and symptoms that would be concerning for cancer recurrence, and I stressed the importance of calling if she develops any of these.   ALLERGIES:  is allergic to codeine and coumadin [warfarin].  Meds: Current Outpatient Medications  Medication Sig Dispense Refill   ACETAMINOPHEN-BUTALBITAL 50-325 MG TABS Take 1 tablet by mouth daily as needed (tension headache).     amphetamine-dextroamphetamine (ADDERALL XR) 30 MG 24 hr capsule Take 30 mg by mouth every morning.     ASPERCREME LIDOCAINE EX Apply 1 application topically daily as needed (pain).     aspirin EC 81 MG tablet Take 81 mg by mouth in the morning and at bedtime. Swallow whole.     b complex vitamins capsule Take 1 capsule by mouth daily.     BIOTIN PO Take 1 capsule by mouth at bedtime.     bisacodyl (DULCOLAX) 5 MG EC tablet Take 5 mg by mouth daily as needed (constipation).     buPROPion (WELLBUTRIN XL) 300  MG 24 hr tablet Take 300 mg by mouth every morning.     cholecalciferol (VITAMIN D) 1000 UNITS tablet Take 2,000 Units by mouth at bedtime.     diclofenac Sodium (VOLTAREN) 1 % GEL Apply 1 application topically daily as needed (pain).     Estradiol 10 MCG TABS vaginal tablet Place 1 tablet  (10 mcg total) vaginally 2 (two) times a week. 12 tablet 6   folic acid (FOLVITE) 400 MCG tablet Take 400 mcg by mouth daily.     Ginkgo Biloba 120 MG CAPS Take 120 mg by mouth at bedtime.     hydrochlorothiazide 25 MG tablet Take 25 mg by mouth every morning. Per patient for fluid retention not HTN     Ibuprofen 200 MG CAPS Take 400 mg by mouth 2 (two) times daily as needed (pain).     magnesium 30 MG tablet Take 30 mg by mouth 2 (two) times daily.     Melatonin 10 MG TABS Take 10 mg by mouth at bedtime.     Multiple Vitamin (MULTIVITAMIN WITH MINERALS) TABS tablet Take 1 tablet by mouth at bedtime.     Probiotic Product (PROBIOTIC PO) Take 1 tablet by mouth at bedtime.     propranolol (INDERAL) 20 MG tablet Take 20 mg by mouth daily as needed.     REPATHA SURECLICK 140 MG/ML SOAJ Inject 140 mg into the skin every 14 (fourteen) days.     thyroid (ARMOUR) 60 MG tablet Take 60 mg by mouth daily before breakfast.     Turmeric 500 MG CAPS Take 500 mg by mouth at bedtime.     zinc gluconate 50 MG tablet Take 50 mg by mouth daily.     No current facility-administered medications for this encounter.    Physical Findings: The patient is in no acute distress. Patient is alert and oriented.  height is 5' 1.5" (1.562 m) and weight is 118 lb (53.5 kg). Her temporal temperature is 97.8 F (36.6 C). Her blood pressure is 121/56 (abnormal) and her pulse is 71. Her respiration is 18 and oxygen saturation is 100%. .    Gen: Well-appearing and in no acute distress HEENT: normocephalic, EOMI Abd: soft NT/ND Lymphatics: no palpable groin adenopathy Ext: no edema   GYN: Speculum exam revealed no evidence of dehiscence at the vaginal apex. There is still some narrowing at the apex. Vaginal cuff intact. No sign of nodularity or tumor.  Benign appearing telangiectasia vs submucosal blood (2mm) at 4:00 left lower quadrant of mid vagina.    Lab Findings: Lab Results  Component Value Date   WBC 5.9  11/06/2021   HGB 14.3 11/06/2021   HCT 41.4 11/06/2021   MCV 91.0 11/06/2021   PLT 269 11/06/2021   Radiographic Findings: No results found.  Impression/Plan:   No evidence of disease recurrence on physical exam today.   She knows to continue using her dilator and see gynecologic oncology in 52mo, sooner prn.  Rad/Onc will see her back for follow-up in 79mo. She is pleased with this plan.  On date of service, in total, I spent 30 minutes on this encounter. Patient was seen in person. Note signed after encounter date; minutes pertain to date of service, only.  _____________________________________      Lonie Peak, MD

## 2023-10-07 DIAGNOSIS — E7800 Pure hypercholesterolemia, unspecified: Secondary | ICD-10-CM | POA: Diagnosis not present

## 2024-01-01 ENCOUNTER — Encounter: Payer: Self-pay | Admitting: Gynecologic Oncology

## 2024-01-01 ENCOUNTER — Inpatient Hospital Stay: Payer: Medicare PPO | Attending: Gynecologic Oncology | Admitting: Gynecologic Oncology

## 2024-01-01 VITALS — BP 134/55 | HR 62 | Temp 97.9°F | Resp 16 | Ht 61.5 in | Wt 118.6 lb

## 2024-01-01 DIAGNOSIS — Z90710 Acquired absence of both cervix and uterus: Secondary | ICD-10-CM | POA: Diagnosis not present

## 2024-01-01 DIAGNOSIS — Z9079 Acquired absence of other genital organ(s): Secondary | ICD-10-CM | POA: Diagnosis not present

## 2024-01-01 DIAGNOSIS — Z90722 Acquired absence of ovaries, bilateral: Secondary | ICD-10-CM | POA: Diagnosis not present

## 2024-01-01 DIAGNOSIS — C541 Malignant neoplasm of endometrium: Secondary | ICD-10-CM

## 2024-01-01 DIAGNOSIS — Z8542 Personal history of malignant neoplasm of other parts of uterus: Secondary | ICD-10-CM

## 2024-01-01 DIAGNOSIS — Z08 Encounter for follow-up examination after completed treatment for malignant neoplasm: Secondary | ICD-10-CM

## 2024-01-01 NOTE — Patient Instructions (Signed)
 It was good to see you today.  I do not see or feel any evidence of cancer recurrence on your exam.  I will see you for follow-up in November.  As always, if you develop any new and concerning symptoms before your next visit, please call to see me sooner.

## 2024-01-01 NOTE — Progress Notes (Signed)
 Gynecologic Oncology Return Clinic Visit  01/01/24  Reason for Visit: Surveillance visit in the setting of early stage uterine cancer   Treatment History: Oncology History Overview Note  MMR IHC intact MS stable   Endometrial cancer (HCC)  10/29/2021 Initial Biopsy   EMB: FIGO grade 2 endometrioid endometrial adenocarcinoma.   11/05/2021 Initial Diagnosis   Endometrial cancer (HCC)   11/09/2021 Imaging   CT A/P: 1. Patient's known uterine/cervical cancer is poorly evaluated on CT. No evidence of metastatic disease within the abdomen or pelvis. 2. Homogeneous low-density 15 mm right ovarian cyst. No follow-up imaging recommended. Note: This recommendation does not apply to premenarchal patients and to those with increased risk (genetic, family history, elevated tumor markers or other high-risk factors) of ovarian cancer. Reference: JACR 2020 Feb; 17(2):248-254 3.  Aortic Atherosclerosis (ICD10-I70.0).   11/13/2021 Surgery   TRH/BSO, SLN biopsy, repair of posterior vaginal laceration  Findings:  On EUA, small mobile uterus. On intra-abdominal entry, normal upper abdominal survey. Normal omentum, small and large bowel. Uterus 6-8 cm, normal in appearance. Normal appearing bilateral adnexa. No obvious adenopathy; mapping successful to bilateral obturator SLNs. No pelvic or abdominal evidence of disease.    11/13/2021 Pathology Results   A. LYMPH NODE, SENTINEL, RIGHT OBTURATOR, EXCISION:  - No carcinoma identified in 1 lymph node (0/1).   B. LYMPH NODE, SENTINEL, RIGHT OBTURATOR #2, EXCISION:  - No carcinoma identified in 1 lymph node (0/1).   C. LYMPH NODE, SENTINEL, LEFT OBTURATOR, EXCISION:  - No carcinoma identified in 1 lymph node (0/1).   D. 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  - Tumor invades less than half of of myometrial  thickness.  - Cervix: Unremarkable.  No atypia, dysplasia, or malignancy.  Endometrial carcinoma does not involve cervix.  - Bilateral fallopian tubes: Unremarkable segments of fimbriated  fallopian tube.  No malignancy.  - Right ovary: Benign cyst.  No malignancy.  - Left ovary: Benign cysts.  No malignancy.   ONCOLOGY TABLE:   UTERUS, CARCINOMA OR CARCINOSARCOMA: Resection   Procedure: Total hysterectomy and bilateral salpingo-oophorectomy  Histologic Type: Endometrioid adenocarcinoma  Histologic Grade: 2  Myometrial Invasion:       Depth of Myometrial Invasion (mm): 1 mm       Myometrial Thickness (mm): 11 mm       Percentage of Myometrial Invasion: 9%  Uterine Serosa Involvement: Not identified  Cervical stromal Involvement: Not identified  Extent of involvement of other tissue/organs: Not identified  Peritoneal/Ascitic Fluid: Not applicable  Lymphovascular Invasion: Not identified  Regional Lymph Nodes: Present.  No carcinoma identified in any of 3  lymph nodes (0/3).       Pelvic Lymph Nodes Examined:                                   Sentinel: 3                                   Non-sentinel: 0                                   Total :3       Pelvic Lymph Nodes  with Metastasis: 0                           Macrometastasis: (>2.0 mm): 0                          Micrometastasis: (>0.2 mm and < 2.0 mm): 0                            Isolated Tumor Cells (<0.2 mm): 0                       Laterality of Lymph Node with Tumor: Not applicable                             Extracapsular Extension: Not applicable       Para-aortic Lymph Nodes Examined: 0  Distant Metastasis:       Distant Site(s) Involved: Not applicable  Pathologic Stage Classification (pTNM, AJCC 8th Edition): pT1a pN 0  Ancillary Studies: MMR / MSI testing will be ordered  Representative Tumor Block: D6  Comment(s):  Pancytokeratin was performed on the lymph nodes and is  negative.    12/18/2021 Cancer  Staging   Staging form: Corpus Uteri - Carcinoma and Carcinosarcoma, AJCC 8th Edition - Pathologic stage from 12/18/2021: FIGO Stage IA (pT1a, pN0, cM0) - Signed by Colie Dawes, MD on 12/18/2021 Stage prefix: Initial diagnosis Histologic grade (G): G2 Histologic grading system: 3 grade system   01/29/2022 - 02/12/2022 Radiation Therapy   01/29/2022 through 02/12/2022 Site Technique Total Dose (Gy) Dose per Fx (Gy) Completed Fx Beam Energies  Vagina: Pelvis_vagina HDR-brachy 30/30 6 5/5 Ir-192       Interval History: Doing well.  Denies any vaginal bleeding.  Reports baseline bowel bladder function.  Looking forward to a trip to [place]  to see her sons over Mother's Day and then watch her grandson graduate from college.  Past Medical/Surgical History: Past Medical History:  Diagnosis Date   ADHD (attention deficit hyperactivity disorder)    Anxiety    Arthritis    in thumbs. more in rt thumb   Basal cell carcinoma    Leg and back of R arm    Depression    Hyperlipidemia    Hypokalemia    Hypothyroidism    Lower leg edema    PMB (postmenopausal bleeding) 01/2002   endo polyp   Squamous cell skin cancer    Thyroid disease 2002   hypothyroidism    Past Surgical History:  Procedure Laterality Date   CHOLECYSTECTOMY, LAPAROSCOPIC  06/07/2011   HAMMERTOE RECONSTRUCTION WITH WEIL OSTEOTOMY Right 09/25/2017   Procedure: Right 2-4 metatarsal Weil osteotomies and hammertoe corrections;  Surgeon: Amada Backer, MD;  Location: [facility];  Service: Orthopedics;  Laterality: Right;   HAND SURGERY Right    HYSTEROSCOPY  01/07/2002   endo polyp and focal simple hyperplasia   LEFT THUMB SURGERY      POPLITEAL SYNOVIAL CYST EXCISION Left 2008 and 2010   ROBOTIC ASSISTED TOTAL HYSTERECTOMY WITH BILATERAL SALPINGO OOPHERECTOMY Bilateral 11/13/2021   Procedure: XI ROBOTIC ASSISTED TOTAL HYSTERECTOMY WITH BILATERAL SALPINGO,OOPHORECTOMY;  Surgeon: Suzi Essex, MD;  Location:  [facility];  Service: Gynecology;  Laterality: Bilateral;   SENTINEL NODE BIOPSY N/A 11/13/2021   Procedure: SENTINEL NODE BIOPSY;  Surgeon: Suzi Essex,  MD;  Location: [facility];  Service: Gynecology;  Laterality: N/A;   TONSILLECTOMY      Family History  Problem Relation Age of Onset   Cancer Mother        multiple myeloma   Cancer Father        oral   Diabetes Father    Diabetes Brother    Prostate cancer Brother    Diabetes Brother    Heart disease Brother 79       secondary to drug use   Uterine cancer Maternal Grandmother    Diabetes Paternal Grandmother    Osteoporosis Other    Colon cancer Neg Hx    Breast cancer Neg Hx    Ovarian cancer Neg Hx    Endometrial cancer Neg Hx    Pancreatic cancer Neg Hx     Social History   Socioeconomic History   Marital status: Widowed    Spouse name: Not on file   Number of children: 2   Years of education: Not on file   Highest education level: Master's degree (e.g., MA, MS, MEng, MEd, MSW, MBA)  Occupational History   Occupation: retired  Tobacco Use   Smoking status: Never   Smokeless tobacco: Never  Vaping Use   Vaping status: Never Used  Substance and Sexual Activity   Alcohol use: No   Drug use: No   Sexual activity: Not Currently    Partners: Male    Birth control/protection: Post-menopausal, Surgical  Other Topics Concern   Not on file  Social History Narrative   Husband died 01-17-07. Not working outside the home.   Social Drivers of Health   Financial Resource Strain: Low Risk  (06/11/2021)   Received from [facility] visits prior to 11/09/2022., [facility] visits prior to 11/09/2022.   Overall Financial Resource Strain (CARDIA)    Difficulty of Paying Living Expenses: Not hard at all  Food Insecurity: Low Risk  (08/11/2023)   [facility]   Hunger Vital Sign    Worried About Running Out of Food in the Last Year: Never true    Ran Out of Food in the Last  Year: Never true  Transportation Needs: No Transportation Needs (08/11/2023)   Received from [facility]    In the past 12 months, has lack of reliable transportation kept you from medical appointments, meetings, work or from getting things needed for daily living? : No  Physical Activity: Sufficiently Active (06/11/2021)   Received from [facility] visits prior to 11/09/2022., [facility] visits prior to 11/09/2022.   Exercise Vital Sign    Days of Exercise per Week: 5 days    Minutes of Exercise per Session: 60 min  Stress: No Stress Concern Present (06/11/2021)   [facility] visits prior to 11/09/2022., [facility] visits prior to 11/09/2022.   [employer] of Occupational Health - Occupational Stress Questionnaire    Feeling of Stress : Only a little  Social Connections: Moderately Integrated (06/11/2021)   [facility] from [facility] visits prior to 11/09/2022., [facility] visits prior to 11/09/2022.   Social Connection and Isolation Panel [NHANES]    Frequency of Communication with Friends and Family: More than three times a week    Frequency of Social Gatherings with Friends and Family: More than three times a week  Attends Religious Services: More than 4 times per year    Active Member of Clubs or Organizations: Yes    Attends Banker Meetings: More than 4 times per year    Marital Status: Widowed    Current Medications:  Current Outpatient Medications:    ACETAMINOPHEN -BUTALBITAL 50-325 MG TABS, Take 1 tablet by mouth daily as needed (tension headache)., Disp: , Rfl:    amphetamine-dextroamphetamine (ADDERALL XR) 30 MG 24 hr capsule, Take 30 mg by mouth every morning., Disp: , Rfl:    ASPERCREME LIDOCAINE  EX, Apply 1 application topically daily as needed (pain)., Disp: , Rfl:    aspirin EC 81 MG tablet, Take 81  mg by mouth in the morning and at bedtime. Swallow whole., Disp: , Rfl:    b complex vitamins capsule, Take 1 capsule by mouth daily., Disp: , Rfl:    BIOTIN PO, Take 1 capsule by mouth at bedtime., Disp: , Rfl:    bisacodyl (DULCOLAX) 5 MG EC tablet, Take 5 mg by mouth daily as needed (constipation)., Disp: , Rfl:    buPROPion (WELLBUTRIN XL) 300 MG 24 hr tablet, Take 300 mg by mouth every morning., Disp: , Rfl:    cholecalciferol (VITAMIN D ) 1000 UNITS tablet, Take 2,000 Units by mouth at bedtime., Disp: , Rfl:    diclofenac Sodium (VOLTAREN) 1 % GEL, Apply 1 application topically daily as needed (pain)., Disp: , Rfl:    Estradiol  10 MCG TABS vaginal tablet, Place 1 tablet (10 mcg total) vaginally 2 (two) times a week., Disp: 12 tablet, Rfl: 6   folic acid (FOLVITE) 400 MCG tablet, Take 400 mcg by mouth daily., Disp: , Rfl:    Ginkgo Biloba 120 MG CAPS, Take 120 mg by mouth at bedtime., Disp: , Rfl:    hydrochlorothiazide 25 MG tablet, Take 25 mg by mouth every morning. Per patient for fluid retention not HTN, Disp: , Rfl:    Ibuprofen 200 MG CAPS, Take 400 mg by mouth 2 (two) times daily as needed (pain)., Disp: , Rfl:    magnesium 30 MG tablet, Take 30 mg by mouth 2 (two) times daily., Disp: , Rfl:    Melatonin 10 MG TABS, Take 10 mg by mouth at bedtime., Disp: , Rfl:    Multiple Vitamin (MULTIVITAMIN WITH MINERALS) TABS tablet, Take 1 tablet by mouth at bedtime., Disp: , Rfl:    Probiotic Product (PROBIOTIC PO), Take 1 tablet by mouth at bedtime., Disp: , Rfl:    propranolol (INDERAL) 20 MG tablet, Take 20 mg by mouth daily as needed., Disp: , Rfl:    REPATHA SURECLICK 140 MG/ML SOAJ, Inject 140 mg into the skin every 14 (fourteen) days., Disp: , Rfl:    thyroid (ARMOUR) 60 MG tablet, Take 60 mg by mouth daily before breakfast., Disp: , Rfl:    Turmeric 500 MG CAPS, Take 500 mg by mouth at bedtime., Disp: , Rfl:    zinc gluconate 50 MG tablet, Take 50 mg by mouth daily., Disp: , Rfl:    Review of Systems: Denies appetite changes, fevers, chills, fatigue, unexplained weight changes. Denies hearing loss, neck lumps or masses, mouth sores, ringing in ears or voice changes. Denies cough or wheezing.  Denies shortness of breath. Denies chest pain or palpitations. Denies leg swelling. Denies abdominal distention, pain, blood in stools, constipation, diarrhea, nausea, vomiting, or early satiety. Denies pain with intercourse, dysuria, frequency, hematuria or incontinence. Denies hot flashes, pelvic pain, vaginal bleeding or vaginal discharge.   Denies joint  pain, back pain or muscle pain/cramps. Denies itching, rash, or wounds. Denies dizziness, headaches, numbness or seizures. Denies swollen lymph nodes or glands, denies easy bruising or bleeding. Denies anxiety, depression, confusion, or decreased concentration.  Physical Exam: BP (!) 134/55 (BP Location: Left Arm, Patient Position: Sitting) Comment: CMA notified  Pulse 62   Temp 97.9 F (36.6 C) (Oral)   Resp 16   Ht 5' 1.5" (1.562 m)   Wt 118 lb 9.6 oz (53.8 kg)   LMP 09/10/2003   SpO2 100%   BMI 22.05 kg/m  General: Alert, oriented, no acute distress. HEENT: Normocephalic, atraumatic, sclera anicteric. Chest: Clear to auscultation bilaterally.  No wheezes or rhonchi. Cardiovascular: Regular rate and rhythm, no murmurs. Abdomen: soft, nontender.  Normoactive bowel sounds.  No masses or hepatosplenomegaly appreciated.  Well-healed incisions. Extremities: Grossly normal range of motion.  Warm, well perfused.  No edema bilaterally. Skin: No rashes or lesions noted. Lymphatics: No cervical, supraclavicular, or inguinal adenopathy. GU: Normal appearing external genitalia without erythema, excoriation, or lesions.  Speculum exam reveals moderately atrophic vaginal apex with some mild narrowing just of the apex.  Along the posterior aspect, there is some atypical vascularity with radiation changes, stable from her last  exam.  Bimanual exam reveals smooth mucosa, no masses or nodularity.  Rectovaginal exam confirms these findings.  Laboratory & Radiologic Studies: None new  Assessment & Plan: Catherine Peterson is a 73 y.o. woman with stage IA2 grade 2 endometrioid endometrial adenocarcinoma who completed adjuvant VBT in 02/2022.  MMRp.   Patient is overall doing well.  She is NED on exam today, vaginal exam findings unchanged from her last visit.  She was encouraged to continue using her vaginal dilator.    Per NCCN surveillance recommendations, we discussed alternating visits every 3 months between my office and her radiation oncologist for 2-3 years for we transition to visits every 6 months.  Scheduled to see Dr. Lurena Sally in July.  I will see her back in November.  We reviewed signs and symptoms that would be concerning for cancer recurrence, and I stressed the importance of calling if she develops any of these.   20 minutes of total time was spent for this patient encounter, including preparation, face-to-face counseling with the patient and coordination of care, and documentation of the encounter.  Wiley Hanger, MD  Division of Gynecologic Oncology  Department of Obstetrics and Gynecology  [facility]

## 2024-01-07 ENCOUNTER — Telehealth: Payer: Self-pay

## 2024-01-07 NOTE — Telephone Encounter (Signed)
 Office received a refill request from [facility] on Estradiol  10mcg vaginal tablets. Pt was seen by Dr. Orvil Bland on 01/01/24.   Please advise (refill form on desk for review)

## 2024-01-07 NOTE — Telephone Encounter (Signed)
 Refill request reviewed by Vira Grieves NP and faxed back to pharmacy

## 2024-03-30 ENCOUNTER — Ambulatory Visit
Admission: RE | Admit: 2024-03-30 | Discharge: 2024-03-30 | Disposition: A | Discharge status: Home / Self Care | Payer: Self-pay | Source: Ambulatory Visit | Attending: Radiation Oncology | Admitting: Radiation Oncology

## 2024-03-30 VITALS — BP 138/76 | HR 94 | Temp 97.6°F | Resp 18 | Ht 61.5 in | Wt 119.4 lb

## 2024-03-30 DIAGNOSIS — C541 Malignant neoplasm of endometrium: Secondary | ICD-10-CM

## 2024-03-30 NOTE — Progress Notes (Signed)
 Radiation Oncology         [phone] ________________________________  Name: Catherine Peterson MRN: [id number]  Date: 03/30/2024  DOB: 04/30/1951  Follow-Up Visit Note  Outpatient  CC: Debrah Josette ORN., PA-C  Debrah Josette ORN., PA-C  Diagnosis and Prior Radiotherapy:    ICD-10-CM   1. Endometrial cancer (HCC)  C54.1      Cancer Staging  Endometrial cancer [facility]) Staging form: Corpus Uteri - Carcinoma and Carcinosarcoma, AJCC 8th Edition - Pathologic stage from 12/18/2021: FIGO Stage IA (pT1a, pN0, cM0) - Signed by Izell Domino, MD on 12/18/2021 Stage prefix: Initial diagnosis Histologic grade (G): G2 Histologic grading system: 3 grade system  Radiation Treatment Dates: 01/29/2022 through 02/12/2022 Site Technique Total Dose (Gy) Dose per Fx (Gy) Completed Fx Beam Energies  Vagina: Pelvis_vagina HDR-brachy 30/30 6 5/5 Ir-192   CHIEF COMPLAINT: Here for follow-up and surveillance of uterine cancer  Narrative:  Ms. Catherine Peterson presents today for follow-up after completing vaginal brachytherapy on 02/12/2022.   Does the patient complain of any of the following:   Pain: Denies Abdominal bloating: Yes Diarrhea/Constipation: None Nausea/Vomiting: None Vaginal Discharge: Minimal faint pink spotting with suspension use Blood in Urine or Stool: None Urinary Issues (dysuria/incomplete emptying/ incontinence/ increased frequency/urgency): Increased frequency of urination when she has a UTI Does patient report using vaginal dilator 2-3 times a week and/or sexually active 2-3 weeks: Yes Post radiation skin changes: None   Additional comments if applicable: Saw Dr. Comer Dollar on 01/01/2024; at that time the patient was doing well overall with no evidence of disease on exam. Vaginal findings were unchanged from her last visit. She recommended alternating visits every 3 months with her office and radiation oncology. She is scheduled to see the patient next on December 5th, 2025.     ALLERGIES:  is allergic to codeine, coumadin [warfarin], and prednisone & diphenhydramine.  Meds: Current Outpatient Medications  Medication Sig Dispense Refill   ACETAMINOPHEN -BUTALBITAL 50-325 MG TABS Take 1 tablet by mouth daily as needed (tension headache).     amphetamine-dextroamphetamine (ADDERALL XR) 30 MG 24 hr capsule Take 30 mg by mouth every morning.     ASPERCREME LIDOCAINE  EX Apply 1 application topically daily as needed (pain).     aspirin EC 81 MG tablet Take 81 mg by mouth in the morning and at bedtime. Swallow whole.     b complex vitamins capsule Take 1 capsule by mouth daily.     BIOTIN PO Take 1 capsule by mouth at bedtime.     bisacodyl (DULCOLAX) 5 MG EC tablet Take 5 mg by mouth daily as needed (constipation).     buPROPion (WELLBUTRIN XL) 300 MG 24 hr tablet Take 300 mg by mouth every morning.     cholecalciferol (VITAMIN D ) 1000 UNITS tablet Take 2,000 Units by mouth at bedtime.     diclofenac Sodium (VOLTAREN) 1 % GEL Apply 1 application topically daily as needed (pain).     Estradiol  10 MCG TABS vaginal tablet Place 1 tablet (10 mcg total) vaginally 2 (two) times a week. 12 tablet 6   folic acid (FOLVITE) 400 MCG tablet Take 400 mcg by mouth daily.     Ginkgo Biloba 120 MG CAPS Take 120 mg by mouth at bedtime.     hydrochlorothiazide 25 MG tablet Take 25 mg by mouth every morning. Per patient for fluid retention not HTN     Ibuprofen 200 MG CAPS Take 400 mg by mouth 2 (two) times daily as needed (pain).  magnesium 30 MG tablet Take 30 mg by mouth 2 (two) times daily.     Melatonin 10 MG TABS Take 10 mg by mouth at bedtime.     Multiple Vitamin (MULTIVITAMIN WITH MINERALS) TABS tablet Take 1 tablet by mouth at bedtime.     Probiotic Product (PROBIOTIC PO) Take 1 tablet by mouth at bedtime.     propranolol (INDERAL) 20 MG tablet Take 20 mg by mouth daily as needed.     REPATHA SURECLICK 140 MG/ML SOAJ Inject 140 mg into the skin every 14 (fourteen) days.      thyroid (ARMOUR) 60 MG tablet Take 60 mg by mouth daily before breakfast.     Turmeric 500 MG CAPS Take 500 mg by mouth at bedtime.     zinc gluconate 50 MG tablet Take 50 mg by mouth daily.     No current facility-administered medications for this encounter.    Physical Findings: The patient is in no acute distress. Patient is alert and oriented.  height is 5' 1.5 (1.562 m) and weight is 119 lb 6.4 oz (54.2 kg). Her temperature is 97.6 F (36.4 C). Her blood pressure is 138/76 and her pulse is 94. Her respiration is 18 and oxygen saturation is 100%. .    Gen: Well-appearing and in no acute distress Heart: RRR Pulmonary: Lungs CTA in all fields HEENT: normocephalic, EOMI Abd: soft NT/ND Lymphatics: no palpable groin adenopathy Ext: no edema  GYN: external genitalia appears normal. Speculum exam revealed no evidence of dehiscence at the vaginal apex. There is still some narrowing at the apex. Vaginal cuff intact. No sign of nodularity or tumor.  Benign telangiectatic changes noted. No rectovaginal fistula  Lab Findings: Lab Results  Component Value Date   WBC 5.9 11/06/2021   HGB 14.3 11/06/2021   HCT 41.4 11/06/2021   MCV 91.0 11/06/2021   PLT 269 11/06/2021   Radiographic Findings: No results found.  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   Per NCCN guidelines, patient will follow up with Dr. Viktoria in 3 months and radiation oncology in 6 months. Patient was educated on signs/symptoms that may indicate disease recurrence and understands to notify us  if she experiences any of them.    On date of service, in total, we spent 30 minutes on this encounter. Patient was seen in person.   _____________________________________    Leeroy Due, PA-C   Lauraine Golden, MD    [facility]  Radiation Oncology Direct Dial: [phone]  Fax:  [phone] [email or web address]

## 2024-03-30 NOTE — Progress Notes (Signed)
 Catherine Peterson is here today for follow up after completing vaginal brachytherapy on 02/12/2022.    Does the patient complain of any of the following:  Pain: Denies Abdominal bloating: Yes Diarrhea/Constipation: None Nausea/Vomiting: None Vaginal Discharge: Minimal faint pink spotting Blood in Urine or Stool: None Urinary Issues (dysuria/incomplete emptying/ incontinence/ increased frequency/urgency): Increased frequency of urination when she has a  Does patient report using vaginal dilator 2-3 times a week and/or sexually active 2-3 weeks:  Post radiation skin changes: None   Additional comments if applicable: UTI sporadically, takes AZO medications.   BP 138/76 (BP Location: Left Arm, Patient Position: Sitting, Cuff Size: Normal)   Pulse 94   Temp 97.6 F (36.4 C)   Resp 18   Ht 5' 1.5 (1.562 m)   Wt 119 lb 6.4 oz (54.2 kg)   LMP 09/10/2003   SpO2 100%   BMI 22.20 kg/m     Wt Readings from Last 3 Encounters:  03/30/24 119 lb 6.4 oz (54.2 kg)  01/01/24 118 lb 9.6 oz (53.8 kg)  09/26/23 118 lb (53.5 kg)

## 2024-07-20 ENCOUNTER — Ambulatory Visit
Admission: RE | Admit: 2024-07-20 | Discharge: 2024-07-20 | Disposition: A | Source: Ambulatory Visit | Attending: Family Medicine | Admitting: Family Medicine

## 2024-07-20 ENCOUNTER — Other Ambulatory Visit: Payer: Self-pay | Admitting: Family Medicine

## 2024-07-20 DIAGNOSIS — Z1231 Encounter for screening mammogram for malignant neoplasm of breast: Secondary | ICD-10-CM

## 2024-08-06 ENCOUNTER — Ambulatory Visit: Admitting: Gynecologic Oncology

## 2024-08-11 ENCOUNTER — Encounter: Payer: Self-pay | Admitting: Gynecologic Oncology

## 2024-08-13 ENCOUNTER — Ambulatory Visit (HOSPITAL_BASED_OUTPATIENT_CLINIC_OR_DEPARTMENT_OTHER): Admitting: Student

## 2024-08-13 ENCOUNTER — Encounter: Payer: Self-pay | Admitting: Gynecologic Oncology

## 2024-08-13 ENCOUNTER — Ambulatory Visit (HOSPITAL_BASED_OUTPATIENT_CLINIC_OR_DEPARTMENT_OTHER)

## 2024-08-13 ENCOUNTER — Inpatient Hospital Stay: Attending: Gynecologic Oncology | Admitting: Gynecologic Oncology

## 2024-08-13 VITALS — BP 134/72 | HR 77 | Temp 98.2°F | Resp 18 | Wt 119.4 lb

## 2024-08-13 DIAGNOSIS — Z90722 Acquired absence of ovaries, bilateral: Secondary | ICD-10-CM | POA: Diagnosis not present

## 2024-08-13 DIAGNOSIS — M79675 Pain in left toe(s): Secondary | ICD-10-CM | POA: Diagnosis not present

## 2024-08-13 DIAGNOSIS — N952 Postmenopausal atrophic vaginitis: Secondary | ICD-10-CM | POA: Diagnosis not present

## 2024-08-13 DIAGNOSIS — Z923 Personal history of irradiation: Secondary | ICD-10-CM | POA: Insufficient documentation

## 2024-08-13 DIAGNOSIS — Z8542 Personal history of malignant neoplasm of other parts of uterus: Secondary | ICD-10-CM | POA: Diagnosis not present

## 2024-08-13 DIAGNOSIS — Z9079 Acquired absence of other genital organ(s): Secondary | ICD-10-CM | POA: Diagnosis not present

## 2024-08-13 DIAGNOSIS — C541 Malignant neoplasm of endometrium: Secondary | ICD-10-CM

## 2024-08-13 DIAGNOSIS — Z90710 Acquired absence of both cervix and uterus: Secondary | ICD-10-CM | POA: Insufficient documentation

## 2024-08-13 NOTE — Patient Instructions (Signed)
 It was good to see you today.  I do not see or feel any evidence of cancer recurrence on your exam.  We will see you for follow-up in 9 months.  As always, if you develop any new and concerning symptoms before your next visit, please call to see me sooner.

## 2024-08-13 NOTE — Progress Notes (Signed)
 Gynecologic Oncology Return Clinic Visit  08/13/24  Reason for Visit: Surveillance visit in the setting of early stage uterine cancer   Treatment History: Oncology History Overview Note  MMR IHC intact MS stable   Endometrial cancer (HCC)  10/29/2021 Initial Biopsy   EMB: FIGO grade 2 endometrioid endometrial adenocarcinoma.   11/05/2021 Initial Diagnosis   Endometrial cancer (HCC)   11/09/2021 Imaging   CT A/P: 1. Patient's known uterine/cervical cancer is poorly evaluated on CT. No evidence of metastatic disease within the abdomen or pelvis. 2. Homogeneous low-density 15 mm right ovarian cyst. No follow-up imaging recommended. Note: This recommendation does not apply to premenarchal patients and to those with increased risk (genetic, family history, elevated tumor markers or other high-risk factors) of ovarian cancer. Reference: JACR 2020 Feb; 17(2):248-254 3.  Aortic Atherosclerosis (ICD10-I70.0).   11/13/2021 Surgery   TRH/BSO, SLN biopsy, repair of posterior vaginal laceration  Findings:  On EUA, small mobile uterus. On intra-abdominal entry, normal upper abdominal survey. Normal omentum, small and large bowel. Uterus 6-8 cm, normal in appearance. Normal appearing bilateral adnexa. No obvious adenopathy; mapping successful to bilateral obturator SLNs. No pelvic or abdominal evidence of disease.    11/13/2021 Pathology Results   A. LYMPH NODE, SENTINEL, RIGHT OBTURATOR, EXCISION:  - No carcinoma identified in 1 lymph node (0/1).   B. LYMPH NODE, SENTINEL, RIGHT OBTURATOR #2, EXCISION:  - No carcinoma identified in 1 lymph node (0/1).   C. LYMPH NODE, SENTINEL, LEFT OBTURATOR, EXCISION:  - No carcinoma identified in 1 lymph node (0/1).   D. 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  - Tumor invades less than half of of myometrial  thickness.  - Cervix: Unremarkable.  No atypia, dysplasia, or malignancy.  Endometrial carcinoma does not involve cervix.  - Bilateral fallopian tubes: Unremarkable segments of fimbriated  fallopian tube.  No malignancy.  - Right ovary: Benign cyst.  No malignancy.  - Left ovary: Benign cysts.  No malignancy.   ONCOLOGY TABLE:   UTERUS, CARCINOMA OR CARCINOSARCOMA: Resection   Procedure: Total hysterectomy and bilateral salpingo-oophorectomy  Histologic Type: Endometrioid adenocarcinoma  Histologic Grade: 2  Myometrial Invasion:       Depth of Myometrial Invasion (mm): 1 mm       Myometrial Thickness (mm): 11 mm       Percentage of Myometrial Invasion: 9%  Uterine Serosa Involvement: Not identified  Cervical stromal Involvement: Not identified  Extent of involvement of other tissue/organs: Not identified  Peritoneal/Ascitic Fluid: Not applicable  Lymphovascular Invasion: Not identified  Regional Lymph Nodes: Present.  No carcinoma identified in any of 3  lymph nodes (0/3).       Pelvic Lymph Nodes Examined:                                   Sentinel: 3                                   Non-sentinel: 0                                   Total :3       Pelvic Lymph Nodes  with Metastasis: 0                           Macrometastasis: (>2.0 mm): 0                          Micrometastasis: (>0.2 mm and < 2.0 mm): 0                            Isolated Tumor Cells (<0.2 mm): 0                       Laterality of Lymph Node with Tumor: Not applicable                             Extracapsular Extension: Not applicable       Para-aortic Lymph Nodes Examined: 0  Distant Metastasis:       Distant Site(s) Involved: Not applicable  Pathologic Stage Classification (pTNM, AJCC 8th Edition): pT1a pN 0  Ancillary Studies: MMR / MSI testing will be ordered  Representative Tumor Block: D6  Comment(s):  Pancytokeratin was performed on the lymph nodes and is  negative.    12/18/2021 Cancer  Staging   Staging form: Corpus Uteri - Carcinoma and Carcinosarcoma, AJCC 8th Edition - Pathologic stage from 12/18/2021: FIGO Stage IA (pT1a, pN0, cM0) - Signed by Izell Domino, MD on 12/18/2021 Stage prefix: Initial diagnosis Histologic grade (G): G2 Histologic grading system: 3 grade system   01/29/2022 - 02/12/2022 Radiation Therapy   01/29/2022 through 02/12/2022 Site Technique Total Dose (Gy) Dose per Fx (Gy) Completed Fx Beam Energies  Vagina: Pelvis_vagina HDR-brachy 30/30 6 5/5 Ir-192       Interval History: Doing well.  Denies any abdominal or pelvic pain.  Continues to use her vaginal dilator regularly.  Denies any vaginal bleeding.  Spent Thanksgiving holiday in [place] , looking forward to returning for Christmas in the new year.  Past Medical/Surgical History: Past Medical History:  Diagnosis Date   ADHD (attention deficit hyperactivity disorder)    Anxiety    Arthritis    in thumbs. more in rt thumb   Basal cell carcinoma    Leg and back of R arm    Depression    Hyperlipidemia    Hypokalemia    Hypothyroidism    Lower leg edema    PMB (postmenopausal bleeding) 01/2002   endo polyp   Squamous cell skin cancer    Thyroid disease 2002   hypothyroidism    Past Surgical History:  Procedure Laterality Date   CHOLECYSTECTOMY, LAPAROSCOPIC  06/07/2011   HAMMERTOE RECONSTRUCTION WITH WEIL OSTEOTOMY Right 09/25/2017   Procedure: Right 2-4 metatarsal Weil osteotomies and hammertoe corrections;  Surgeon: Kit Rush, MD;  Location: [facility];  Service: Orthopedics;  Laterality: Right;   HAND SURGERY Right    HYSTEROSCOPY  01/07/2002   endo polyp and focal simple hyperplasia   LEFT THUMB SURGERY      POPLITEAL SYNOVIAL CYST EXCISION Left 2008 and 2010   ROBOTIC ASSISTED TOTAL HYSTERECTOMY WITH BILATERAL SALPINGO OOPHERECTOMY Bilateral 11/13/2021   Procedure: XI ROBOTIC ASSISTED TOTAL HYSTERECTOMY WITH BILATERAL SALPINGO,OOPHORECTOMY;  Surgeon: Viktoria Comer SAUNDERS, MD;  Location: [facility];  Service: Gynecology;  Laterality: Bilateral;   SENTINEL NODE BIOPSY N/A 11/13/2021   Procedure: SENTINEL NODE BIOPSY;  Surgeon: Viktoria,  Comer SAUNDERS, MD;  Location: [facility];  Service: Gynecology;  Laterality: N/A;   TONSILLECTOMY      Family History  Problem Relation Age of Onset   Cancer Mother        multiple myeloma   Cancer Father        oral   Diabetes Father    Diabetes Brother    Prostate cancer Brother    Diabetes Brother    Heart disease Brother 67       secondary to drug use   Uterine cancer Maternal Grandmother    Diabetes Paternal Grandmother    Osteoporosis Other    Colon cancer Neg Hx    Breast cancer Neg Hx    Ovarian cancer Neg Hx    Endometrial cancer Neg Hx    Pancreatic cancer Neg Hx     Social History   Socioeconomic History   Marital status: Widowed    Spouse name: Not on file   Number of children: 2   Years of education: Not on file   Highest education level: Master's degree (e.g., MA, MS, MEng, MEd, MSW, MBA)  Occupational History   Occupation: retired  Tobacco Use   Smoking status: Never   Smokeless tobacco: Never  Vaping Use   Vaping status: Never Used  Substance and Sexual Activity   Alcohol use: No   Drug use: No   Sexual activity: Not Currently    Partners: Male    Birth control/protection: Post-menopausal, Surgical  Other Topics Concern   Not on file  Social History Narrative   Husband died Sep 10, 2007. Not working outside the home.   Social Drivers of Health   Financial Resource Strain: Low Risk  (06/11/2021)   Received from [facility] visits prior to 11/09/2022.   Overall Financial Resource Strain (CARDIA)    Difficulty of Paying Living Expenses: Not hard at all  Food Insecurity: Low Risk  (08/06/2024)   Received from [facility]   Hunger Vital Sign    Within the past 12 months, you worried that your food would run out before you got money to buy more: Never true    Within  the past 12 months, the food you bought just didn't last and you didn't have money to get more. : Never true  Transportation Needs: No Transportation Needs (08/06/2024)   Received from [facility]    In the past 12 months, has lack of reliable transportation kept you from medical appointments, meetings, work or from getting things needed for daily living? : No  Physical Activity: Sufficiently Active (06/11/2021)   Received from [facility] visits prior to 11/09/2022.   Exercise Vital Sign    On average, how many days per week do you engage in moderate to strenuous exercise (like a brisk walk)?: 5 days    On average, how many minutes do you engage in exercise at this level?: 60 min  Stress: No Stress Concern Present (06/11/2021)   Received from [facility] visits prior to 11/09/2022.   [employer] of Occupational Health - Occupational Stress Questionnaire    Feeling of Stress : Only a little  Social Connections: Moderately Integrated (06/11/2021)   Received from [facility] visits prior to 11/09/2022.   Social Connection and Isolation Panel    In a typical week, how many times do you talk on the phone with family, friends, or neighbors?: More than three times  a week    How often do you get together with friends or relatives?: More than three times a week    How often do you attend church or religious services?: More than 4 times per year    Do you belong to any clubs or organizations such as church groups, unions, fraternal or athletic groups, or school groups?: Yes    How often do you attend meetings of the clubs or organizations you belong to?: More than 4 times per year    Are you married, widowed, divorced, separated, never married, or living with a partner?: Widowed    Current Medications:  Current Outpatient Medications:    ACETAMINOPHEN -BUTALBITAL 50-325 MG TABS, Take 1 tablet by mouth daily as needed  (tension headache)., Disp: , Rfl:    amphetamine-dextroamphetamine (ADDERALL XR) 30 MG 24 hr capsule, Take 30 mg by mouth every morning., Disp: , Rfl:    ASPERCREME LIDOCAINE  EX, Apply 1 application topically daily as needed (pain)., Disp: , Rfl:    aspirin EC 81 MG tablet, Take 81 mg by mouth in the morning and at bedtime. Swallow whole., Disp: , Rfl:    b complex vitamins capsule, Take 1 capsule by mouth daily., Disp: , Rfl:    BIOTIN PO, Take 1 capsule by mouth at bedtime., Disp: , Rfl:    bisacodyl (DULCOLAX) 5 MG EC tablet, Take 5 mg by mouth daily as needed (constipation)., Disp: , Rfl:    buPROPion (WELLBUTRIN XL) 300 MG 24 hr tablet, Take 300 mg by mouth every morning., Disp: , Rfl:    cholecalciferol (VITAMIN D ) 1000 UNITS tablet, Take 2,000 Units by mouth at bedtime., Disp: , Rfl:    diclofenac Sodium (VOLTAREN) 1 % GEL, Apply 1 application topically daily as needed (pain)., Disp: , Rfl:    Estradiol  10 MCG TABS vaginal tablet, Place 1 tablet (10 mcg total) vaginally 2 (two) times a week., Disp: 12 tablet, Rfl: 6   folic acid (FOLVITE) 400 MCG tablet, Take 400 mcg by mouth daily., Disp: , Rfl:    Ginkgo Biloba 120 MG CAPS, Take 120 mg by mouth at bedtime., Disp: , Rfl:    hydrochlorothiazide 25 MG tablet, Take 25 mg by mouth every morning. Per patient for fluid retention not HTN, Disp: , Rfl:    Ibuprofen 200 MG CAPS, Take 400 mg by mouth 2 (two) times daily as needed (pain)., Disp: , Rfl:    magnesium 30 MG tablet, Take 30 mg by mouth 2 (two) times daily., Disp: , Rfl:    Melatonin 10 MG TABS, Take 10 mg by mouth at bedtime., Disp: , Rfl:    Multiple Vitamin (MULTIVITAMIN WITH MINERALS) TABS tablet, Take 1 tablet by mouth at bedtime., Disp: , Rfl:    Probiotic Product (PROBIOTIC PO), Take 1 tablet by mouth at bedtime., Disp: , Rfl:    propranolol (INDERAL) 20 MG tablet, Take 20 mg by mouth daily as needed., Disp: , Rfl:    REPATHA SURECLICK 140 MG/ML SOAJ, Inject 140 mg into the skin  every 14 (fourteen) days., Disp: , Rfl:    thyroid (ARMOUR) 60 MG tablet, Take 60 mg by mouth daily before breakfast., Disp: , Rfl:    Turmeric 500 MG CAPS, Take 500 mg by mouth at bedtime., Disp: , Rfl:    zinc gluconate 50 MG tablet, Take 50 mg by mouth daily., Disp: , Rfl:   Review of Systems: Denies appetite changes, fevers, chills, fatigue, unexplained weight changes. Denies hearing loss, neck lumps  or masses, mouth sores, ringing in ears or voice changes. Denies cough or wheezing.  Denies shortness of breath. Denies chest pain or palpitations. Denies leg swelling. Denies abdominal distention, pain, blood in stools, constipation, diarrhea, nausea, vomiting, or early satiety. Denies pain with intercourse, dysuria, frequency, hematuria or incontinence. Denies hot flashes, pelvic pain, vaginal bleeding or vaginal discharge.   Denies joint pain, back pain or muscle pain/cramps. Denies itching, rash, or wounds. Denies dizziness, headaches, numbness or seizures. Denies swollen lymph nodes or glands, denies easy bruising or bleeding. Denies anxiety, depression, confusion, or decreased concentration.  Physical Exam: BP 134/72 (BP Location: Right Arm, Patient Position: Sitting)   Pulse 77   Temp 98.2 F (36.8 C) (Oral)   Resp 18   Wt 119 lb 6.4 oz (54.2 kg)   LMP 09/10/2003   SpO2 99%   BMI 22.20 kg/m  General: Alert, oriented, no acute distress. HEENT: Normocephalic, atraumatic, sclera anicteric. Chest: Clear to auscultation bilaterally.  No wheezes or rhonchi. Cardiovascular: Regular rate and rhythm, no murmurs. Abdomen: soft, nontender.  Normoactive bowel sounds.  No masses or hepatosplenomegaly appreciated.  Well-healed incisions. Extremities: Grossly normal range of motion.  Warm, well perfused.  No edema bilaterally. Skin: No rashes or lesions noted. Lymphatics: No cervical, supraclavicular, or inguinal adenopathy. GU: Normal appearing external genitalia without erythema,  excoriation, or lesions.  Speculum exam reveals moderately atrophic vaginal apex with some mild narrowing just of the apex.  Along the posterior aspect, there is some atypical vascularity with radiation changes, stable from her last exam.  Bimanual exam reveals smooth mucosa, no masses or nodularity.  Rectovaginal exam confirms these findings.  Laboratory & Radiologic Studies: None new  Assessment & Plan: Catherine Peterson is a 73 y.o. woman with stage IA2 grade 2 endometrioid endometrial adenocarcinoma who completed adjuvant VBT in 02/2022.  MMRp.   Patient is overall doing well.  She is NED on exam today, vaginal exam findings unchanged from her last visit.  She was encouraged to continue using her vaginal dilator.    Per NCCN surveillance recommendations, we discussed alternating visits every 3 months between my office and her radiation oncologist for 2-3 years for we transition to visits every 6 months.  Scheduled to see Dr. Izell in March.  At that time, the patient will be nearly 3 years out from completion of adjuvant therapy.  We will transition to visits every 6 months.  We will plan to see her back in September.  We reviewed signs and symptoms that would be concerning for cancer recurrence, and I stressed the importance of calling if she develops any of these.  20 minutes of total time was spent for this patient encounter, including preparation, face-to-face counseling with the patient and coordination of care, and documentation of the encounter.  Comer Dollar, MD  Division of Gynecologic Oncology  Department of Obstetrics and Gynecology  [facility] [place]  [facility]

## 2024-08-13 NOTE — Progress Notes (Signed)
 Chief Complaint: Left foot pain    Discussed the use of AI scribe software for clinical note transcription with the patient, who gave verbal consent to proceed.  History of Present Illness Catherine Peterson is a pleasant 73 year old female who presents with left foot pain.  She reports significant pain at the base of the left second toe that began 6 weeks ago during [place] the [place], with pain worsening mid-walk and associated swelling. The pain is provoked by walking and toe movement and is absent when non-weight bearing. Ice, Epsom salt soaks, and elevation provide only temporary relief. She tried bandaging, taping the toes together, and a metatarsal pad, which either irritated the area or gave minimal benefit. She has used ibuprofen every four hours and Voltaren gel without meaningful improvement. She denies current numbness or tingling. Initial tingling and calf pain after walking have resolved. She has history of prior right foot surgery, but this left forefoot pain is new.   Surgical History:   None  PMH/PSH/Family History/Social History/Meds/Allergies:    Past Medical History:  Diagnosis Date   ADHD (attention deficit hyperactivity disorder)    Anxiety    Arthritis    in thumbs. more in rt thumb   Basal cell carcinoma    Leg and back of R arm    Depression    Hyperlipidemia    Hypokalemia    Hypothyroidism    Lower leg edema    PMB (postmenopausal bleeding) 01/2002   endo polyp   Squamous cell skin cancer    Thyroid disease 08-25-2001   hypothyroidism   Past Surgical History:  Procedure Laterality Date   CHOLECYSTECTOMY, LAPAROSCOPIC  06/07/2011   HAMMERTOE RECONSTRUCTION WITH WEIL OSTEOTOMY Right 09/25/2017   Procedure: Right 2-4 metatarsal Weil osteotomies and hammertoe corrections;  Surgeon: Kit Rush, MD;  Location: [facility];  Service: Orthopedics;  Laterality: Right;   HAND SURGERY Right     HYSTEROSCOPY  01/07/2002   endo polyp and focal simple hyperplasia   LEFT THUMB SURGERY      POPLITEAL SYNOVIAL CYST EXCISION Left August 26, 2007 and 08-25-09   ROBOTIC ASSISTED TOTAL HYSTERECTOMY WITH BILATERAL SALPINGO OOPHERECTOMY Bilateral 11/13/2021   Procedure: XI ROBOTIC ASSISTED TOTAL HYSTERECTOMY WITH BILATERAL SALPINGO,OOPHORECTOMY;  Surgeon: Viktoria Comer SAUNDERS, MD;  Location: [facility];  Service: Gynecology;  Laterality: Bilateral;   SENTINEL NODE BIOPSY N/A 11/13/2021   Procedure: SENTINEL NODE BIOPSY;  Surgeon: Viktoria Comer SAUNDERS, MD;  Location: [facility];  Service: Gynecology;  Laterality: N/A;   TONSILLECTOMY     Social History   Socioeconomic History   Marital status: Widowed    Spouse name: Not on file   Number of children: 2   Years of education: Not on file   Highest education level: Master's degree (e.g., MA, MS, MEng, MEd, MSW, MBA)  Occupational History   Occupation: retired  Tobacco Use   Smoking status: Never   Smokeless tobacco: Never  Vaping Use   Vaping status: Never Used  Substance and Sexual Activity   Alcohol use: No   Drug use: No   Sexual activity: Not Currently    Partners: Male    Birth control/protection: Post-menopausal, Surgical  Other Topics Concern   Not on file  Social History Narrative   Husband died 26-Aug-2007. Not  working outside the home.   Social Drivers of Health   Financial Resource Strain: Low Risk  (06/11/2021)   Received from [facility] visits prior to 11/09/2022.   Overall Financial Resource Strain (CARDIA)    Difficulty of Paying Living Expenses: Not hard at all  Food Insecurity: Low Risk  (08/06/2024)   Received from [facility]   Hunger Vital Sign    Within the past 12 months, you worried that your food would run out before you got money to buy more: Never true    Within the past 12 months, the food you bought just didn't last and you didn't have money to get more. : Never true  Transportation Needs: No Transportation  Needs (08/06/2024)   Received from [facility]    In the past 12 months, has lack of reliable transportation kept you from medical appointments, meetings, work or from getting things needed for daily living? : No  Physical Activity: Sufficiently Active (06/11/2021)   Received from [facility] visits prior to 11/09/2022.   Exercise Vital Sign    On average, how many days per week do you engage in moderate to strenuous exercise (like a brisk walk)?: 5 days    On average, how many minutes do you engage in exercise at this level?: 60 min  Stress: No Stress Concern Present (06/11/2021)   Received from [facility] visits prior to 11/09/2022.   [employer] - Occupational Stress Questionnaire    Feeling of Stress : Only a little  Social Connections: Moderately Integrated (06/11/2021)   Received from [facility] visits prior to 11/09/2022.   Social Connection and Isolation Panel    In a typical week, how many times do you talk on the phone with family, friends, or neighbors?: More than three times a week    How often do you get together with friends or relatives?: More than three times a week    How often do you attend church or religious services?: More than 4 times per year    Do you belong to any clubs or organizations such as church groups, unions, fraternal or athletic groups, or school groups?: Yes    How often do you attend meetings of the clubs or organizations you belong to?: More than 4 times per year    Are you married, widowed, divorced, separated, never married, or living with a partner?: Widowed   Family History  Problem Relation Age of Onset   Cancer Mother        multiple myeloma   Cancer Father        oral   Diabetes Father    Diabetes Brother    Prostate cancer Brother    Diabetes Brother    Heart disease Brother 52       secondary to drug use   Uterine cancer Maternal  Grandmother    Diabetes Paternal Grandmother    Osteoporosis Other    Colon cancer Neg Hx    Breast cancer Neg Hx    Ovarian cancer Neg Hx    Endometrial cancer Neg Hx    Pancreatic cancer Neg Hx    Allergies  Allergen Reactions   Codeine Nausea Only   Coumadin [Warfarin] Other (See Comments)    Severe nose bleeds    Prednisone & Diphenhydramine Rash    Rash on face and blood vessel burst in eye   Current  Outpatient Medications  Medication Sig Dispense Refill   ACETAMINOPHEN -BUTALBITAL 50-325 MG TABS Take 1 tablet by mouth daily as needed (tension headache).     amphetamine-dextroamphetamine (ADDERALL XR) 30 MG 24 hr capsule Take 30 mg by mouth every morning.     ASPERCREME LIDOCAINE  EX Apply 1 application topically daily as needed (pain).     aspirin EC 81 MG tablet Take 81 mg by mouth in the morning and at bedtime. Swallow whole.     b complex vitamins capsule Take 1 capsule by mouth daily.     BIOTIN PO Take 1 capsule by mouth at bedtime.     bisacodyl (DULCOLAX) 5 MG EC tablet Take 5 mg by mouth daily as needed (constipation).     buPROPion (WELLBUTRIN XL) 300 MG 24 hr tablet Take 300 mg by mouth every morning.     cholecalciferol (VITAMIN D ) 1000 UNITS tablet Take 2,000 Units by mouth at bedtime.     diclofenac Sodium (VOLTAREN) 1 % GEL Apply 1 application topically daily as needed (pain).     Estradiol  10 MCG TABS vaginal tablet Place 1 tablet (10 mcg total) vaginally 2 (two) times a week. 12 tablet 6   folic acid (FOLVITE) 400 MCG tablet Take 400 mcg by mouth daily.     Ginkgo Biloba 120 MG CAPS Take 120 mg by mouth at bedtime.     hydrochlorothiazide 25 MG tablet Take 25 mg by mouth every morning. Per patient for fluid retention not HTN     Ibuprofen 200 MG CAPS Take 400 mg by mouth 2 (two) times daily as needed (pain).     magnesium 30 MG tablet Take 30 mg by mouth 2 (two) times daily.     Melatonin 10 MG TABS Take 10 mg by mouth at bedtime.     MIEBO 1.338 GM/ML SOLN  Apply 1 drop to eye 4 (four) times daily.     Multiple Vitamin (MULTIVITAMIN WITH MINERALS) TABS tablet Take 1 tablet by mouth at bedtime.     Probiotic Product (PROBIOTIC PO) Take 1 tablet by mouth at bedtime.     propranolol (INDERAL) 20 MG tablet Take 20 mg by mouth daily as needed.     REPATHA SURECLICK 140 MG/ML SOAJ Inject 140 mg into the skin every 14 (fourteen) days.     thyroid (ARMOUR) 60 MG tablet Take 60 mg by mouth daily before breakfast.     Turmeric 500 MG CAPS Take 500 mg by mouth at bedtime.     zinc gluconate 50 MG tablet Take 50 mg by mouth daily.     No current facility-administered medications for this visit.   No results found.  Review of Systems:   A ROS was performed including pertinent positives and negatives as documented in the HPI.  Physical Exam :   Constitutional: NAD and appears stated age Neurological: Alert and oriented Psych: Appropriate affect and cooperative Last menstrual period 09/10/2003.   Comprehensive Musculoskeletal Exam:    Exam of the left foot demonstrates mild swelling and erythema of the left second toe.  Pain mainly over the dorsal aspect of the second MTP and plantar aspects of the metatarsals.  Flexor and extensor mechanisms are intact.  Brisk capillary refill distally with 2+ DP pulse.  Neurosensory exam intact.  Imaging:   Xray (left 2nd toe 3 views): Negative for acute fracture or dislocation.  There are moderate to severe degenerative changes of the first MTP joint.   I personally reviewed and interpreted the  radiographs.      Assessment & Plan Pain of left second toe Chronic pain at the base of the left toe worsens with weight-bearing. Differential diagnosis includes plantar plate injury, metatarsalgia, or stress fracture.  No significant findings on today's x-rays.  Symptoms have persisted despite buddy taping, metatarsal pads, Voltaren, oral NSAIDs, and icing.  Will provide a postop shoe to help offload the toe which she  can use the metatarsal pads with if desired.  Patient has been established with Dr. Kit for prior surgeries on her contralateral foot, so if symptoms persist or worsen would likely recommend follow-up with him.      I personally saw and evaluated the patient, and participated in the management and treatment plan.  Leonce Reveal, PA-C Orthopedics

## 2024-08-13 NOTE — Addendum Note (Signed)
 Addended by: Noelene Gang B on: 08/13/2024 01:27 PM   Modules accepted: Orders

## 2024-12-07 ENCOUNTER — Ambulatory Visit: Admitting: Radiation Oncology

## 2025-05-24 ENCOUNTER — Inpatient Hospital Stay: Admitting: Gynecologic Oncology

## 2025-05-27 ENCOUNTER — Inpatient Hospital Stay: Admitting: Gynecologic Oncology
